# Patient Record
Sex: Female | Born: 1975 | Race: White | Hispanic: No | Marital: Married | Smoking: Current every day smoker
Health system: Southern US, Community
[De-identification: ages and names within clinical notes are randomized; demographics above are authoritative.]

## PROBLEM LIST (undated history)

## (undated) DIAGNOSIS — R51 Headache: Secondary | ICD-10-CM

## (undated) DIAGNOSIS — K5792 Diverticulitis of intestine, part unspecified, without perforation or abscess without bleeding: Secondary | ICD-10-CM

## (undated) DIAGNOSIS — G5603 Carpal tunnel syndrome, bilateral upper limbs: Secondary | ICD-10-CM

## (undated) DIAGNOSIS — F32A Depression, unspecified: Secondary | ICD-10-CM

## (undated) DIAGNOSIS — F329 Major depressive disorder, single episode, unspecified: Secondary | ICD-10-CM

## (undated) HISTORY — DX: Major depressive disorder, single episode, unspecified: F32.9

## (undated) HISTORY — DX: Diverticulitis of intestine, part unspecified, without perforation or abscess without bleeding: K57.92

## (undated) HISTORY — DX: Depression, unspecified: F32.A

---

## 1999-03-22 ENCOUNTER — Emergency Department (HOSPITAL_COMMUNITY): Admission: EM | Admit: 1999-03-22 | Discharge: 1999-03-22 | Payer: Self-pay | Admitting: Emergency Medicine

## 2008-05-25 ENCOUNTER — Emergency Department (HOSPITAL_COMMUNITY): Admission: EM | Admit: 2008-05-25 | Discharge: 2008-05-25 | Payer: Self-pay | Admitting: Emergency Medicine

## 2009-12-20 ENCOUNTER — Emergency Department (HOSPITAL_COMMUNITY): Admission: EM | Admit: 2009-12-20 | Discharge: 2009-12-20 | Payer: Self-pay | Admitting: Emergency Medicine

## 2010-01-14 ENCOUNTER — Emergency Department (HOSPITAL_COMMUNITY)
Admission: EM | Admit: 2010-01-14 | Discharge: 2010-01-14 | Payer: Self-pay | Source: Home / Self Care | Admitting: Emergency Medicine

## 2011-08-28 ENCOUNTER — Inpatient Hospital Stay (HOSPITAL_COMMUNITY)
Admission: EM | Admit: 2011-08-28 | Discharge: 2011-09-03 | DRG: 391 | Disposition: A | Discharge status: Home / Self Care | Payer: MEDICAID | Attending: General Surgery | Admitting: General Surgery

## 2011-08-28 ENCOUNTER — Emergency Department (HOSPITAL_COMMUNITY): Payer: Self-pay

## 2011-08-28 ENCOUNTER — Encounter (HOSPITAL_COMMUNITY): Payer: Self-pay | Admitting: *Deleted

## 2011-08-28 DIAGNOSIS — F17200 Nicotine dependence, unspecified, uncomplicated: Secondary | ICD-10-CM | POA: Diagnosis present

## 2011-08-28 DIAGNOSIS — K631 Perforation of intestine (nontraumatic): Secondary | ICD-10-CM | POA: Diagnosis present

## 2011-08-28 DIAGNOSIS — R590 Localized enlarged lymph nodes: Secondary | ICD-10-CM | POA: Diagnosis present

## 2011-08-28 DIAGNOSIS — K5780 Diverticulitis of intestine, part unspecified, with perforation and abscess without bleeding: Secondary | ICD-10-CM

## 2011-08-28 DIAGNOSIS — K5732 Diverticulitis of large intestine without perforation or abscess without bleeding: Secondary | ICD-10-CM

## 2011-08-28 DIAGNOSIS — K5720 Diverticulitis of large intestine with perforation and abscess without bleeding: Secondary | ICD-10-CM | POA: Diagnosis present

## 2011-08-28 DIAGNOSIS — Z6836 Body mass index (BMI) 36.0-36.9, adult: Secondary | ICD-10-CM

## 2011-08-28 DIAGNOSIS — E669 Obesity, unspecified: Secondary | ICD-10-CM | POA: Diagnosis present

## 2011-08-28 HISTORY — DX: Headache: R51

## 2011-08-28 LAB — URINALYSIS, ROUTINE W REFLEX MICROSCOPIC
Ketones, ur: NEGATIVE mg/dL
Leukocytes, UA: NEGATIVE
Nitrite: NEGATIVE
Specific Gravity, Urine: 1.025 (ref 1.005–1.030)
pH: 6 (ref 5.0–8.0)

## 2011-08-28 LAB — COMPREHENSIVE METABOLIC PANEL
BUN: 11 mg/dL (ref 6–23)
Calcium: 9 mg/dL (ref 8.4–10.5)
Creatinine, Ser: 0.82 mg/dL (ref 0.50–1.10)
GFR calc Af Amer: >90 mL/min (ref >90)
Glucose, Bld: 125 mg/dL — ABNORMAL HIGH (ref 70–99)
Total Protein: 8.1 g/dL (ref 6.0–8.3)

## 2011-08-28 LAB — CBC WITH DIFFERENTIAL/PLATELET
Eosinophils Absolute: 0 10*3/uL (ref 0.0–0.7)
Eosinophils Relative: 0 % (ref 0–5)
Hemoglobin: 14.4 g/dL (ref 12.0–15.0)
Lymphs Abs: 1.4 10*3/uL (ref 0.7–4.0)
MCH: 30.1 pg (ref 26.0–34.0)
MCHC: 34.4 g/dL (ref 30.0–36.0)
MCV: 87.3 fL (ref 78.0–100.0)
Monocytes Relative: 7 % (ref 3–12)
RBC: 4.79 MIL/uL (ref 3.87–5.11)

## 2011-08-28 LAB — URINE MICROSCOPIC-ADD ON

## 2011-08-28 MED ORDER — PANTOPRAZOLE SODIUM 40 MG IV SOLR
40.0000 mg | Freq: Every day | INTRAVENOUS | Status: DC
  Administered 2011-08-28 – 2011-09-02 (×6): 40 mg via INTRAVENOUS
  Filled 2011-08-28 (×7): qty 40

## 2011-08-28 MED ORDER — ONDANSETRON HCL 4 MG/2ML IJ SOLN
4.0000 mg | Freq: Four times a day (QID) | INTRAMUSCULAR | Status: DC | PRN
  Administered 2011-08-28 – 2011-09-02 (×6): 4 mg via INTRAVENOUS
  Filled 2011-08-28 (×7): qty 2

## 2011-08-28 MED ORDER — ACETAMINOPHEN 325 MG PO TABS
650.0000 mg | ORAL_TABLET | Freq: Four times a day (QID) | ORAL | Status: DC | PRN
  Administered 2011-08-30 (×2): 650 mg via ORAL
  Filled 2011-08-28 (×2): qty 2

## 2011-08-28 MED ORDER — SODIUM CHLORIDE 0.9 % IV SOLN
1.0000 g | INTRAVENOUS | Status: DC
  Administered 2011-08-29 – 2011-09-02 (×5): 1 g via INTRAVENOUS
  Filled 2011-08-28 (×6): qty 1

## 2011-08-28 MED ORDER — IOHEXOL 300 MG/ML  SOLN
100.0000 mL | Freq: Once | INTRAMUSCULAR | Status: AC | PRN
  Administered 2011-08-28: 100 mL via INTRAVENOUS

## 2011-08-28 MED ORDER — SODIUM CHLORIDE 0.9 % IV SOLN
INTRAVENOUS | Status: DC
  Administered 2011-08-28: 125 mL/h via INTRAVENOUS

## 2011-08-28 MED ORDER — MORPHINE SULFATE 2 MG/ML IJ SOLN
1.0000 mg | INTRAMUSCULAR | Status: DC | PRN
  Administered 2011-08-28: 4 mg via INTRAVENOUS
  Administered 2011-08-28: 2 mg via INTRAVENOUS
  Administered 2011-08-28: 4 mg via INTRAVENOUS
  Administered 2011-08-29 – 2011-08-31 (×17): 2 mg via INTRAVENOUS
  Administered 2011-08-31: 4 mg via INTRAVENOUS
  Administered 2011-09-01 – 2011-09-02 (×4): 2 mg via INTRAVENOUS
  Filled 2011-08-28 (×13): qty 1
  Filled 2011-08-28: qty 2
  Filled 2011-08-28 (×5): qty 1
  Filled 2011-08-28: qty 2
  Filled 2011-08-28: qty 1
  Filled 2011-08-28: qty 2
  Filled 2011-08-28 (×4): qty 1

## 2011-08-28 MED ORDER — HYOSCYAMINE SULFATE 0.125 MG PO TABS
0.2500 mg | ORAL_TABLET | Freq: Once | ORAL | Status: AC
  Administered 2011-08-28: 0.25 mg via ORAL
  Filled 2011-08-28: qty 2

## 2011-08-28 MED ORDER — ACETAMINOPHEN 650 MG RE SUPP: 650.0000 mg | Freq: Four times a day (QID) | RECTAL | Status: DC | PRN

## 2011-08-28 MED ORDER — ONDANSETRON HCL 4 MG/2ML IJ SOLN
4.0000 mg | Freq: Once | INTRAMUSCULAR | Status: AC
  Administered 2011-08-28: 4 mg via INTRAVENOUS
  Filled 2011-08-28: qty 2

## 2011-08-28 MED ORDER — POTASSIUM CHLORIDE IN NACL 20-0.9 MEQ/L-% IV SOLN
INTRAVENOUS | Status: DC
  Administered 2011-08-28 – 2011-08-30 (×5): via INTRAVENOUS
  Administered 2011-08-30: 100 mL/h via INTRAVENOUS
  Administered 2011-08-31 – 2011-09-03 (×5): via INTRAVENOUS
  Filled 2011-08-28 (×17): qty 1000

## 2011-08-28 MED ORDER — HEPARIN SODIUM (PORCINE) 5000 UNIT/ML IJ SOLN
5000.0000 [IU] | Freq: Three times a day (TID) | INTRAMUSCULAR | Status: DC
Start: 2011-08-28 — End: 2011-09-03
  Administered 2011-08-28 – 2011-09-03 (×18): 5000 [IU] via SUBCUTANEOUS
  Filled 2011-08-28 (×22): qty 1

## 2011-08-28 MED ORDER — SODIUM CHLORIDE 0.9 % IV BOLUS (SEPSIS)
500.0000 mL | Freq: Once | INTRAVENOUS | Status: AC
  Administered 2011-08-28: 500 mL via INTRAVENOUS

## 2011-08-28 MED ORDER — HYDROMORPHONE HCL PF 1 MG/ML IJ SOLN
1.0000 mg | Freq: Once | INTRAMUSCULAR | Status: AC
  Administered 2011-08-28: 1 mg via INTRAVENOUS
  Filled 2011-08-28: qty 1

## 2011-08-28 MED ORDER — SODIUM CHLORIDE 0.9 % IV SOLN
1.0000 g | Freq: Once | INTRAVENOUS | Status: AC
  Administered 2011-08-28: 1 g via INTRAVENOUS
  Filled 2011-08-28: qty 1

## 2011-08-28 MED ORDER — NICOTINE 14 MG/24HR TD PT24
14.0000 mg | MEDICATED_PATCH | Freq: Every day | TRANSDERMAL | Status: DC | PRN
  Administered 2011-08-28: 14 mg via TRANSDERMAL
  Filled 2011-08-28 (×3): qty 1

## 2011-08-28 NOTE — Consult Note (Signed)
Reason for Consult:diverticulitis Referring Physician: Georgene Kopper is an 36 y.o. female.  HPI: Patient's a 36 year old female who presents with onset of abdominal pain Wednesday morning he was in her groin and then moved her lower abdomen. She describes it as sharp was coming and going all day yesterday toes she presented to the emergency room and was treated. She developed nausea and vomiting this a.m. Pain was on improved after medication in the ER. Her last bowel movement was 2 days ago there was no blood, she is normally quite regular. She does not complain of pain while lying still. She said it is very painful on the drive in this morning from home. She's not been able to Eat for the last 24 hours. Labs on evaluation in the ER shows a white count of 23,000. H./H. stable. UA shows a large amount of blood nitrates are negative there were many bacteria. CT scan shows sigmoid diverticula stranding the pericolonic fat in the mid pelvis medial to the sigmoid colon above the bladder there are small pockets of extraluminal air noted within the mesentery adjacent to the sigmoid colon. Findings are consistent with perforated diverticulitis with no abscess noted. There is a short segment of thickened small bowel wall in the terminal small bowel mid pelvis adjacent to the sigmoid colon most likely inflammatory enteritis. We were asked by the emergency room to see the patient in consultation.         History reviewed. No pertinent past medical history. History of headaches and painful dentition. She takes [place] powders for this daily.  History reviewed. No pertinent past surgical history. None  No family history on file. mother died of a rectal cancer with metastases. Father with coronary disease, family history of colon cancer. One brother living and in good health.  Social History:  Tobacco: One pack per day for 16 years. EtOH: None  drugs: Occasional marijuana use. Allergies: No Known  Allergies  Medications:  Prior to Admission:  (Not in a hospital admission) Scheduled:   . ertapenem  1 g Intravenous Once  .  HYDROmorphone (DILAUDID) injection  1 mg Intravenous Once  . hyoscyamine  0.25 mg Oral Once  . ondansetron  4 mg Intravenous Once  . sodium chloride  500 mL Intravenous Once   Continuous:   . sodium chloride 125 mL/hr (08/28/11 1302)   [id number] Anti-infectives     Start     Dose/Rate Route Frequency Ordered Stop   08/28/11 1300   ertapenem (INVANZ) 1 g in sodium chloride 0.9 % 50 mL IVPB        1 g 100 mL/hr over 30 Minutes Intravenous  Once 08/28/11 1247 08/28/11 1336          Results for orders placed during the hospital encounter of 08/28/11 (from the past 48 hour(s))  URINALYSIS, ROUTINE W REFLEX MICROSCOPIC     Status: Abnormal   Collection Time   08/28/11  8:37 AM      Component Value Range Comment   Color, Urine AMBER (*) YELLOW BIOCHEMICALS MAY BE AFFECTED BY COLOR   APPearance HAZY (*) CLEAR    Specific Gravity, Urine 1.025  1.005 - 1.030    pH 6.0  5.0 - 8.0    Glucose, UA NEGATIVE  NEGATIVE mg/dL    Hgb urine dipstick LARGE (*) NEGATIVE    Bilirubin Urine SMALL (*) NEGATIVE    Ketones, ur NEGATIVE  NEGATIVE mg/dL    Protein, ur 30 (*) NEGATIVE mg/dL  Urobilinogen, UA 2.0 (*) 0.0 - 1.0 mg/dL    Nitrite NEGATIVE  NEGATIVE    Leukocytes, UA NEGATIVE  NEGATIVE   PREGNANCY, URINE     Status: Normal   Collection Time   08/28/11  8:37 AM      Component Value Range Comment   Preg Test, Ur NEGATIVE  NEGATIVE   URINE MICROSCOPIC-ADD ON     Status: Abnormal   Collection Time   08/28/11  8:37 AM      Component Value Range Comment   Squamous Epithelial / LPF RARE  RARE    WBC, UA 0-2  <3 WBC/hpf    RBC / HPF 7-10  <3 RBC/hpf    Bacteria, UA MANY (*) RARE    Urine-Other MUCOUS PRESENT     CBC WITH DIFFERENTIAL     Status: Abnormal   Collection Time   08/28/11  8:44 AM      Component Value Range Comment   WBC 23.0 (*) 4.0 - 10.5  K/uL    RBC 4.79  3.87 - 5.11 MIL/uL    Hemoglobin 14.4  12.0 - 15.0 g/dL    HCT 11.9  14.7 - 82.9 %    MCV 87.3  78.0 - 100.0 fL    MCH 30.1  26.0 - 34.0 pg    MCHC 34.4  30.0 - 36.0 g/dL    RDW 56.2  13.0 - 86.5 %    Platelets 236  150 - 400 K/uL    Neutrophils Relative 88 (*) 43 - 77 %    Neutro Abs 20.1 (*) 1.7 - 7.7 K/uL    Lymphocytes Relative 6 (*) 12 - 46 %    Lymphs Abs 1.4  0.7 - 4.0 K/uL    Monocytes Relative 7  3 - 12 %    Monocytes Absolute 1.5 (*) 0.1 - 1.0 K/uL    Eosinophils Relative 0  0 - 5 %    Eosinophils Absolute 0.0  0.0 - 0.7 K/uL    Basophils Relative 0  0 - 1 %    Basophils Absolute 0.0  0.0 - 0.1 K/uL   COMPREHENSIVE METABOLIC PANEL     Status: Abnormal   Collection Time   08/28/11  8:44 AM      Component Value Range Comment   Sodium 132 (*) 135 - 145 mEq/L    Potassium 3.6  3.5 - 5.1 mEq/L    Chloride 97  96 - 112 mEq/L    CO2 21  19 - 32 mEq/L    Glucose, Bld 125 (*) 70 - 99 mg/dL    BUN 11  6 - 23 mg/dL    Creatinine, Ser 7.84  0.50 - 1.10 mg/dL    Calcium 9.0  8.4 - 69.6 mg/dL    Total Protein 8.1  6.0 - 8.3 g/dL    Albumin 3.6  3.5 - 5.2 g/dL    AST 11  0 - 37 U/L    ALT 10  0 - 35 U/L    Alkaline Phosphatase 77  39 - 117 U/L    Total Bilirubin 1.0  0.3 - 1.2 mg/dL    GFR calc non Af Amer >90  >90 mL/min    GFR calc Af Amer >90  >90 mL/min     Ct Abdomen Pelvis W Contrast  08/28/2011  *RADIOLOGY REPORT*  Clinical Data: Diffuse abdominal pain, nausea, vomiting, elevated WBC  CT ABDOMEN AND PELVIS WITH CONTRAST  Technique:  Multidetector CT imaging of the  abdomen and pelvis was performed following the standard protocol during bolus administration of intravenous contrast.  Contrast: OMNIPAQUE IOHEXOL 300 MG/ML  SOLN  Comparison: None.  Findings: Lung bases are unremarkable.  Sagittal images of the spine shows no destructive bony lesions.  Liver, spleen, pancreas and adrenals are unremarkable.  No calcified gallstones are noted within  gallbladder.  No aortic aneurysm.  Enhanced kidneys are symmetrical in size.  No hydronephrosis or hydroureter.  No small bowel obstruction.  Multiple small mesenteric lymph nodes are noted the largest in right lower quadrant mesentery measures 9 x 7 mm.  There is no pericecal inflammation.  Normal appendix is partially visualized in axial image 62.  The uterus is retroflexed.  Small amount of free fluid is noted within pelvis.  No adnexal mass is noted.  Multiple small pockets of extraluminal air noted in the mid pelvis above the urinary bladder within mesentery adjacent to sigmoid colon.  Sigmoid colon diverticula are noted.  Mild thickening of the sigmoid colon wall.  Findings are consistent with perforated diverticulitis.  There is stranding of mesenteric fat in mid pelvis above the urinary bladder.  In axial image 79 there is a thickened wall small bowel loop internal os small bowel just above the urinary bladder.  This is most likely due to satellite enteritis due to inflammatory changes in the sigmoid colon.  Contrast material is noted within cecum without evidence of small bowel or colonic obstruction.  IMPRESSION:  1.  Sigmoid colon diverticula are noted.  There is stranding of the pericolonic fat in mid pelvis medial to sigmoid colon above the urinary bladder.  Small pockets of extraluminal air noted within mesentery adjacent to sigmoid colon.  Findings are consistent with perforated diverticulitis.  No definite abscess is noted. No extraluminal contrast material.   2.  There is a short segment thickening of the small bowel wall in terminal small bowel mid pelvis adjacent to sigmoid colon. Findings most likely due to satellite enteritis due to inflammatory changes within sigmoid colon. No small bowel obstruction.    3.  Retroflexed uterus.  Normal appendix is partially visualized. 4.  No hydronephrosis or hydroureter.  Critical findings discussed with Renne Crigler from [facility] Emergency room  Original  Report Authenticated By: Natasha Mead, M.D.    Review of Systems  Constitutional: Positive for chills. Negative for weight loss, malaise/fatigue and diaphoresis. Fever: up to 100 yesterday.  Eyes: Negative.   Respiratory: Positive for wheezing (occasional with heavy tobacco use).   Cardiovascular: Positive for orthopnea (she sleeps on 2 pillows for comfort). Negative for claudication, leg swelling and PND.       She snores, no apnea   Gastrointestinal: Positive for nausea, vomiting and abdominal pain. Negative for heartburn, diarrhea, constipation, blood in stool and melena.  Genitourinary: Negative.   Musculoskeletal: Negative.        Complains of pain right thigh due to varicose vein at this site.  Skin: Negative.   Neurological: Negative.  Negative for weakness.  Endo/Heme/Allergies: Negative.   Psychiatric/Behavioral: Positive for depression. The patient is nervous/anxious.        Occasional mariajuana use   Blood pressure 99/51, pulse 71, temperature 98.7 F (37.1 C), temperature source Oral, resp. rate 16, last menstrual period 08/04/2011, SpO2 95.00%. Physical Exam  Constitutional: She is oriented to person, place, and time. She appears well-developed and well-nourished. No distress.  HENT:  Head: Normocephalic and atraumatic.  Nose: Nose normal.  Eyes: Conjunctivae and EOM are normal.  Pupils are equal, round, and reactive to light. Right eye exhibits no discharge. Left eye exhibits no discharge. No scleral icterus.  Neck: Normal range of motion. Neck supple. No JVD present. No tracheal deviation present. No thyromegaly present.  Cardiovascular: Normal rate, regular rhythm, normal heart sounds and intact distal pulses.  Exam reveals no gallop.   No murmur heard. Respiratory: Effort normal and breath sounds normal. No stridor. No respiratory distress. She has no wheezes. She has no rales. She exhibits no tenderness.  GI: Soft. Bowel sounds are normal. She exhibits no distension and  no mass. There is tenderness (she has tenderness all quadrants, but LLQ and RLQ seem the most tender.). There is no rebound and no guarding.  Musculoskeletal: She exhibits no edema.  Lymphadenopathy:    She has no cervical adenopathy.  Neurological: She is alert and oriented to person, place, and time. No cranial nerve deficit.  Skin: Skin is warm and dry. No rash noted. She is not diaphoretic. No erythema. No pallor.  Psychiatric: She has a normal mood and affect. Her behavior is normal. Judgment and thought content normal.    Assessment/Plan: 1. Sigmoid diverticulitis with perforation. 2. Tobacco use 3. BMI 37  Plan: Dr. Andrey Campanile has evaluated the patient and reviewed the CT scan with radiology. Currently plan medical management. She has a large phlegmon on CT scan, so we'll place her in the step down unit tonight. Continue and Invanz. N.p.o. except ice chips, bowel rest. We'll repeat labs in the a.m. and follow her progress closely. Dr. Andrey Campanile has discussed this with the patient and family. [facility] physician assistant for Dr. Gaynelle Adu.     Mustafa Potts 08/28/2011, 2:53 PM

## 2011-08-28 NOTE — ED Provider Notes (Signed)
Medical screening examination/treatment/procedure(s) were conducted as a shared visit with non-physician practitioner(s) and myself.  I personally evaluated the patient during the encounter.  35yF with abdominal pain. CT significant for diverticulitis. Small foci of extraluminal air. No abscess. NPO. Abx and surgical consult.    Raeford Razor, MD 08/28/11 1255

## 2011-08-28 NOTE — ED Notes (Signed)
Pt stating that she has finished drinking contrast. Waiting on CT

## 2011-08-28 NOTE — Progress Notes (Signed)
CM spoke with pt who confirms self [place] resident with no have a pcp county. Discussed the importance of a pcp for f/u. Reviewed Health connect number to assist with finding self pay provider close to pt's residence. Reviewed resources for [facility], general medial clinics, medications-[email or web address], housing, DSS, health Department and other resources in [employer] including crisis programs Pt voiced understanding and appreciation of resources provided

## 2011-08-28 NOTE — ED Provider Notes (Signed)
History     CSN: [id number]  Arrival date & time 08/28/11  0804   First MD Initiated Contact with Patient 08/28/11 [phone]      Chief Complaint  Patient presents with  . Abdominal Pain    (Consider location/radiation/quality/duration/timing/severity/associated sxs/prior treatment) HPI Comments: Patient with history of no past surgeries on her abdomen, G0 P0, presents with complaint of lower abdominal pain that has become more generalized across her entire abdomen for the past day and a half. Patient states that the pain is cramping in nature and will come and go. She has had one episode of nonbloody, nonbilious vomiting today. She denies diarrhea or dysuria, fever/chills. Patient took ibuprofen with some relief. Patient's last period was 3 and half weeks ago and was normal for her. Palpation makes symptoms worse. Onset was acute. Course is constant. Patient has history of IBS but states this feels much different.   Patient is a 36 y.o. female presenting with abdominal pain. The history is provided by the patient.  Abdominal Pain The primary symptoms of the illness include abdominal pain, nausea and vomiting. The primary symptoms of the illness do not include fever, shortness of breath, diarrhea, hematemesis, hematochezia, dysuria, vaginal discharge or vaginal bleeding. The current episode started yesterday. The onset of the illness was sudden. The problem has been gradually worsening.  The patient states that she believes she is currently not pregnant. The patient has not had a change in bowel habit. Additional symptoms associated with the illness include anorexia. Symptoms associated with the illness do not include chills, constipation, hematuria, frequency or back pain. Significant associated medical issues do not include diverticulitis.    History reviewed. No pertinent past medical history.  History reviewed. No pertinent past surgical history.  No family history on file.  History    Substance Use Topics  . Smoking status: Current Everyday Smoker  . Smokeless tobacco: Not on file  . Alcohol Use: No    OB History    Grav Para Term Preterm Abortions TAB SAB Ect Mult Living                  Review of Systems  Constitutional: Negative for fever and chills.  HENT: Negative for sore throat and rhinorrhea.   Eyes: Negative for redness.  Respiratory: Negative for cough and shortness of breath.   Cardiovascular: Negative for chest pain.  Gastrointestinal: Positive for nausea, vomiting, abdominal pain and anorexia. Negative for diarrhea, constipation, blood in stool, hematochezia and hematemesis.  Genitourinary: Negative for dysuria, frequency, hematuria, vaginal bleeding and vaginal discharge.  Musculoskeletal: Negative for myalgias and back pain.  Skin: Negative for rash.  Neurological: Negative for headaches.    Allergies  Review of patient's allergies indicates no known allergies.  Home Medications  No current outpatient prescriptions on file.  BP 120/70  Pulse 89  Temp 98.9 F (37.2 C) (Oral)  Resp 18  SpO2 99%  LMP 08/04/2011  Physical Exam  Nursing note and vitals reviewed. Constitutional: She appears well-developed and well-nourished.  HENT:  Head: Normocephalic and atraumatic.  Eyes: Conjunctivae are normal. Right eye exhibits no discharge. Left eye exhibits no discharge.  Neck: Normal range of motion. Neck supple.  Cardiovascular: Normal rate, regular rhythm and normal heart sounds.   Pulmonary/Chest: Effort normal and breath sounds normal.  Abdominal: Soft. Bowel sounds are normal. She exhibits no distension. There is generalized tenderness. There is no rigidity, no rebound, no guarding, no CVA tenderness, no tenderness at McBurney's point and negative  Murphy's sign.    Neurological: She is alert.  Skin: Skin is warm and dry.  Psychiatric: She has a normal mood and affect.    ED Course  Procedures (including critical care time)  Labs  Reviewed  CBC WITH DIFFERENTIAL - Abnormal; Notable for the following:    WBC 23.0 (*)     Neutrophils Relative 88 (*)     Neutro Abs 20.1 (*)     Lymphocytes Relative 6 (*)     Monocytes Absolute 1.5 (*)     All other components within normal limits  COMPREHENSIVE METABOLIC PANEL - Abnormal; Notable for the following:    Sodium 132 (*)     Glucose, Bld 125 (*)     All other components within normal limits  URINALYSIS, ROUTINE W REFLEX MICROSCOPIC - Abnormal; Notable for the following:    Color, Urine AMBER (*)  BIOCHEMICALS MAY BE AFFECTED BY COLOR   APPearance HAZY (*)     Hgb urine dipstick LARGE (*)     Bilirubin Urine SMALL (*)     Protein, ur 30 (*)     Urobilinogen, UA 2.0 (*)     All other components within normal limits  URINE MICROSCOPIC-ADD ON - Abnormal; Notable for the following:    Bacteria, UA MANY (*)     All other components within normal limits  PREGNANCY, URINE   Ct Abdomen Pelvis W Contrast  08/28/2011  *RADIOLOGY REPORT*  Clinical Data: Diffuse abdominal pain, nausea, vomiting, elevated WBC  CT ABDOMEN AND PELVIS WITH CONTRAST  Technique:  Multidetector CT imaging of the abdomen and pelvis was performed following the standard protocol during bolus administration of intravenous contrast.  Contrast: OMNIPAQUE IOHEXOL 300 MG/ML  SOLN  Comparison: None.  Findings: Lung bases are unremarkable.  Sagittal images of the spine shows no destructive bony lesions.  Liver, spleen, pancreas and adrenals are unremarkable.  No calcified gallstones are noted within gallbladder.  No aortic aneurysm.  Enhanced kidneys are symmetrical in size.  No hydronephrosis or hydroureter.  No small bowel obstruction.  Multiple small mesenteric lymph nodes are noted the largest in right lower quadrant mesentery measures 9 x 7 mm.  There is no pericecal inflammation.  Normal appendix is partially visualized in axial image 62.  The uterus is retroflexed.  Small amount of free fluid is noted within  pelvis.  No adnexal mass is noted.  Multiple small pockets of extraluminal air noted in the mid pelvis above the urinary bladder within mesentery adjacent to sigmoid colon.  Sigmoid colon diverticula are noted.  Mild thickening of the sigmoid colon wall.  Findings are consistent with perforated diverticulitis.  There is stranding of mesenteric fat in mid pelvis above the urinary bladder.  In axial image 79 there is a thickened wall small bowel loop internal os small bowel just above the urinary bladder.  This is most likely due to satellite enteritis due to inflammatory changes in the sigmoid colon.  Contrast material is noted within cecum without evidence of small bowel or colonic obstruction.  IMPRESSION:  1.  Sigmoid colon diverticula are noted.  There is stranding of the pericolonic fat in mid pelvis medial to sigmoid colon above the urinary bladder.  Small pockets of extraluminal air noted within mesentery adjacent to sigmoid colon.  Findings are consistent with perforated diverticulitis.  No definite abscess is noted. No extraluminal contrast material.   2.  There is a short segment thickening of the small bowel wall in terminal small  bowel mid pelvis adjacent to sigmoid colon. Findings most likely due to satellite enteritis due to inflammatory changes within sigmoid colon. No small bowel obstruction.    3.  Retroflexed uterus.  Normal appendix is partially visualized. 4.  No hydronephrosis or hydroureter.  Critical findings discussed with Renne Crigler from [facility] Emergency room  Original Report Authenticated By: Natasha Mead, M.D.     1. Perforated diverticulitis     8:29 AM Patient seen and examined. Work-up initiated. Medications ordered.   Vital signs reviewed and are as follows: Filed Vitals:   08/28/11 0808  BP: 120/70  Pulse: 89  Temp: 98.9 F (37.2 C)  Resp: 18   9:48 AM CT scan ordered given elevated WBC count. Patient just received IV. States her pain is better if she doesn't  move. Patient discussed with Dr. Juleen China.   1:04 PM Spoke with radiologist. Patient has ruptured diverticulitis. Invanz ordered. Patient informed. Her pain is still controlled. D/w Dr. Juleen China. Surgery paged.   1:36 PM Spoke with surgery PA in ED who is aware of patient. Patient is up in room.   BP 99/51  Pulse 71  Temp 98.7 F (37.1 C) (Oral)  Resp 16  SpO2 95%  LMP 08/04/2011   MDM  Surgical admit        Renne Crigler, [place] 08/28/11 1336

## 2011-08-28 NOTE — ED Notes (Signed)
Pt made NPO.

## 2011-08-28 NOTE — ED Notes (Signed)
Pt c/o n/v/abd pain x1.5 days. Emesis x1 today. Generalized abd pain, sharp, shooting, "like menstrual cramps."

## 2011-08-28 NOTE — ED Notes (Signed)
Pt requesting something for pain. Surgeon requested to give patient PRN morphine and zofran. Order confirmed with Diane charge RN

## 2011-08-29 DIAGNOSIS — E669 Obesity, unspecified: Secondary | ICD-10-CM | POA: Diagnosis present

## 2011-08-29 DIAGNOSIS — K5720 Diverticulitis of large intestine with perforation and abscess without bleeding: Secondary | ICD-10-CM | POA: Diagnosis present

## 2011-08-29 DIAGNOSIS — R590 Localized enlarged lymph nodes: Secondary | ICD-10-CM | POA: Diagnosis present

## 2011-08-29 LAB — CBC WITH DIFFERENTIAL/PLATELET
Basophils Absolute: 0 10*3/uL (ref 0.0–0.1)
Basophils Relative: 0 % (ref 0–1)
Eosinophils Relative: 0 % (ref 0–5)
Lymphocytes Relative: 8 % — ABNORMAL LOW (ref 12–46)
MCHC: 33 g/dL (ref 30.0–36.0)
Monocytes Absolute: 1.4 10*3/uL — ABNORMAL HIGH (ref 0.1–1.0)
Neutro Abs: 15 10*3/uL — ABNORMAL HIGH (ref 1.7–7.7)
Platelets: 217 10*3/uL (ref 150–400)
RDW: 12.9 % (ref 11.5–15.5)
WBC: 17.9 10*3/uL — ABNORMAL HIGH (ref 4.0–10.5)

## 2011-08-29 LAB — BASIC METABOLIC PANEL
BUN: 9 mg/dL (ref 6–23)
Calcium: 8.3 mg/dL — ABNORMAL LOW (ref 8.4–10.5)
Creatinine, Ser: 0.81 mg/dL (ref 0.50–1.10)
GFR calc Af Amer: >90 mL/min (ref >90)

## 2011-08-29 NOTE — ED Provider Notes (Signed)
Medical screening examination/treatment/procedure(s) were conducted as a shared visit with non-physician practitioner(s) and myself.  I personally evaluated the patient during the encounter.  Please see completed note for this encounter.  Raeford Razor, MD 08/29/11 [phone]

## 2011-08-29 NOTE — Progress Notes (Signed)
Utilization review completed.  

## 2011-08-29 NOTE — Progress Notes (Signed)
Subjective: Feels better  Less pain  Objective: Vital signs in last 24 hours: Temp:  [98 F (36.7 C)-99.3 F (37.4 C)] 98.7 F (37.1 C) (07/26 1000) Pulse Rate:  [69-81] 80  (07/26 1000) Resp:  [15-18] 18  (07/26 1000) BP: (98-129)/(51-70) 129/65 mmHg (07/26 1000) SpO2:  [95 %-99 %] 99 % (07/26 1000) Weight:  [265 lb (120.203 kg)] 265 lb (120.203 kg) (07/25 1720) Last BM Date: 08/26/11  Intake/Output from previous day: 07/25 0701 - 07/26 0700 In: 1248.3 [I.V.:1248.3] Out: 0  Intake/Output this shift: Total I/O In: -  Out: 3 [Urine:3]  GI: obese.  mild BLQ tenderness but soft nondistended  Lab Results:   Basename 08/29/11 0347 08/28/11 0844  WBC 17.9* 23.0*  HGB 12.7 14.4  HCT 38.5 41.8  PLT 217 236   BMET  Basename 08/29/11 0347 08/28/11 0844  NA 132* 132*  K 3.8 3.6  CL 100 97  CO2 21 21  GLUCOSE 78 125*  BUN 9 11  CREATININE 0.81 0.82  CALCIUM 8.3* 9.0   PT/INR No results found for this basename: LABPROT:2,INR:2 in the last 72 hours ABG No results found for this basename: PHART:2,PCO2:2,PO2:2,HCO3:2 in the last 72 hours  Studies/Results: Ct Abdomen Pelvis W Contrast  08/28/2011  *RADIOLOGY REPORT*  Clinical Data: Diffuse abdominal pain, nausea, vomiting, elevated WBC  CT ABDOMEN AND PELVIS WITH CONTRAST  Technique:  Multidetector CT imaging of the abdomen and pelvis was performed following the standard protocol during bolus administration of intravenous contrast.  Contrast: OMNIPAQUE IOHEXOL 300 MG/ML  SOLN  Comparison: None.  Findings: Lung bases are unremarkable.  Sagittal images of the spine shows no destructive bony lesions.  Liver, spleen, pancreas and adrenals are unremarkable.  No calcified gallstones are noted within gallbladder.  No aortic aneurysm.  Enhanced kidneys are symmetrical in size.  No hydronephrosis or hydroureter.  No small bowel obstruction.  Multiple small mesenteric lymph nodes are noted the largest in right lower quadrant  mesentery measures 9 x 7 mm.  There is no pericecal inflammation.  Normal appendix is partially visualized in axial image 62.  The uterus is retroflexed.  Small amount of free fluid is noted within pelvis.  No adnexal mass is noted.  Multiple small pockets of extraluminal air noted in the mid pelvis above the urinary bladder within mesentery adjacent to sigmoid colon.  Sigmoid colon diverticula are noted.  Mild thickening of the sigmoid colon wall.  Findings are consistent with perforated diverticulitis.  There is stranding of mesenteric fat in mid pelvis above the urinary bladder.  In axial image 79 there is a thickened wall small bowel loop internal os small bowel just above the urinary bladder.  This is most likely due to satellite enteritis due to inflammatory changes in the sigmoid colon.  Contrast material is noted within cecum without evidence of small bowel or colonic obstruction.  IMPRESSION:  1.  Sigmoid colon diverticula are noted.  There is stranding of the pericolonic fat in mid pelvis medial to sigmoid colon above the urinary bladder.  Small pockets of extraluminal air noted within mesentery adjacent to sigmoid colon.  Findings are consistent with perforated diverticulitis.  No definite abscess is noted. No extraluminal contrast material.   2.  There is a short segment thickening of the small bowel wall in terminal small bowel mid pelvis adjacent to sigmoid colon. Findings most likely due to satellite enteritis due to inflammatory changes within sigmoid colon. No small bowel obstruction.    3.  Retroflexed uterus.  Normal appendix is partially visualized. 4.  No hydronephrosis or hydroureter.  Critical findings discussed with Renne Crigler from [facility] Emergency room  Original Report Authenticated By: Natasha Mead, M.D.    Anti-infectives: Anti-infectives     Start     Dose/Rate Route Frequency Ordered Stop   08/29/11 1400   ertapenem (INVANZ) 1 g in sodium chloride 0.9 % 50 mL IVPB        1  g 100 mL/hr over 30 Minutes Intravenous Every 24 hours 08/28/11 1528     08/28/11 1300   ertapenem (INVANZ) 1 g in sodium chloride 0.9 % 50 mL IVPB        1 g 100 mL/hr over 30 Minutes Intravenous  Once 08/28/11 1247 08/28/11 1336          Assessment/Plan: Acute diverticulitis with microperforation   Improved with less pain and decreasing WBC Continue ABX   LOS: 1 day    Amellia Panik A. 08/29/2011

## 2011-08-29 NOTE — Consult Note (Signed)
Pt seen & examined in ED on 7/25 History as below. No prior symptoms  Alert, comfortable, nontoxic Afebrile, not tachy  Obese abd, lower midline-suprapubic-LLQ TTP. No peritonitis, no RT  Labs reviewed CT reviewed with radiologist  Perforated sigmoid diverticulitis Mesenteric LAD Obesity Tobacco use Headaches  Diverticulitis appears contained. Discussed with pt the etiology of diverticulitis and typical management of diverticulitis including non-operative and operative management. Since not toxic appearing, no fever, no tachycardia, no RT/peritonitis - will start with non-operative management- explained that the perforated area may need to be drained in several days if forms an abscess    NPO, bowel rest IV abx Will likely need repeat CT in 5-7 days to follow-up on diverticulitis to see if have formed abscess as well as mesenteric LAD  The mesenteric LAD is abnormal - could be reactive to colonic process or separate etiology Will also need outpt colonoscopy in 6-8 weeks assuming non-operative management is successful.   Mary Sella. Andrey Campanile, MD, FACS General, Bariatric, & Minimally Invasive Surgery [facility] Surgery, [place]

## 2011-08-30 NOTE — Progress Notes (Signed)
Diverticulitis of large intestine with perforation  Subjective: Feels much better today. Less abdominal pain, no nausea, no distention, tolerating clear liquids.Notes somewhat dark urine.  Objective: Vital signs in last 24 hours: Temp:  [98.6 F (37 C)-99.4 F (37.4 C)] 98.6 F (37 C) (07/27 0524) Pulse Rate:  [64-80] 72  (07/27 0524) Resp:  [16-18] 16  (07/27 0524) BP: (109-129)/(54-66) 118/54 mmHg (07/27 0524) SpO2:  [97 %-99 %] 97 % (07/27 0524) Last BM Date: 08/26/11  Intake/Output from previous day: 07/26 0701 - 07/27 0700 In: 2660 [P.O.:240; I.V.:2420] Out: 1753 [Urine:1753] Intake/Output this shift:    General appearance: alert, cooperative and no distress GI: the abdomen is soft. She has some firmness in the left lower quadrant but only very minimal tenderness. There is no rebound or guarding. Bowel sounds are present.  Lab Results:  No results found for this or any previous visit (from the past 24 hour(s)).   Studies/Results Radiology     MEDS, Scheduled    . ertapenem [facility]) IV  1 g Intravenous Q24H  . heparin  5,000 Units Subcutaneous Q8H  . pantoprazole (PROTONIX) IV  40 mg Intravenous QHS     Assessment: Diverticulitis of large intestine with perforation Improving on antibiotic therapy  Plan: Continue current management. If she does well today, we will advance her diet to full liquids tomorrow. We'll recheck a CBC tomorrow  LOS: 2 days    Currie Paris, MD, [facility], [place] [phone]   08/30/2011 8:09 AM

## 2011-08-31 LAB — CBC
HCT: 34.1 % — ABNORMAL LOW (ref 36.0–46.0)
Hemoglobin: 11.3 g/dL — ABNORMAL LOW (ref 12.0–15.0)
MCHC: 33.1 g/dL (ref 30.0–36.0)
RDW: 12.5 % (ref 11.5–15.5)
WBC: 11.5 10*3/uL — ABNORMAL HIGH (ref 4.0–10.5)

## 2011-08-31 MED ORDER — OXYCODONE-ACETAMINOPHEN 5-325 MG PO TABS
1.0000 | ORAL_TABLET | ORAL | Status: DC | PRN
  Administered 2011-08-31 – 2011-09-03 (×16): 1 via ORAL
  Filled 2011-08-31 (×17): qty 1

## 2011-08-31 NOTE — Progress Notes (Signed)
Diverticulitis of large intestine with perforation  Subjective: She feels better today. Less pain but still some in the left lower quadrant. Tolerating clear liquidsHad 3 bowel movements  Objective: Vital signs in last 24 hours: Temp:  [97.8 F (36.6 C)-98.7 F (37.1 C)] 97.8 F (36.6 C) (07/28 0545) Pulse Rate:  [56-61] 61  (07/28 0545) Resp:  [16-18] 18  (07/28 0545) BP: (101-113)/(65-73) 101/65 mmHg (07/28 0545) SpO2:  [97 %-99 %] 98 % (07/28 0545) Last BM Date: 08/30/11  Intake/Output from previous day: 07/27 0701 - 07/28 0700 In: 3150 [P.O.:680; I.V.:2370; IV Piggyback:100] Out: 2250 [Urine:2250] Intake/Output this shift:    General appearance: alert, cooperative and no distress Resp: clear to auscultation bilaterally Cardio: regular rate and rhythm, S1, S2 normal, no murmur, click, rub or gallop GI: soft, nondistended, very mild tenderness in the left lower quadrant. No rebound tenderness. Bowel sounds present.  Lab Results:  Results for orders placed during the hospital encounter of 08/28/11 (from the past 24 hour(s))  CBC     Status: Abnormal   Collection Time   08/31/11  4:05 AM      Component Value Range   WBC 11.5 (*) 4.0 - 10.5 K/uL   RBC 3.85 (*) 3.87 - 5.11 MIL/uL   Hemoglobin 11.3 (*) 12.0 - 15.0 g/dL   HCT 16.1 (*) 09.6 - 04.5 %   MCV 88.6  78.0 - 100.0 fL   MCH 29.4  26.0 - 34.0 pg   MCHC 33.1  30.0 - 36.0 g/dL   RDW 40.9  81.1 - 91.4 %   Platelets 233  150 - 400 K/uL     Studies/Results Radiology     MEDS, Scheduled    . ertapenem [facility]) IV  1 g Intravenous Q24H  . heparin  5,000 Units Subcutaneous Q8H  . pantoprazole (PROTONIX) IV  40 mg Intravenous QHS     Assessment: Diverticulitis of large intestine with perforation Improving with less pain and reduced white count  Plan: Will advance to a full liquid diet.Would recheck abdominal pelvic CT scan on Tuesday.  LOS: 3 days    Currie Paris, MD, [facility]  Surgery, [place] [phone]   08/31/2011 8:06 AM

## 2011-09-01 ENCOUNTER — Other Ambulatory Visit (HOSPITAL_COMMUNITY): Payer: Self-pay

## 2011-09-01 NOTE — Progress Notes (Signed)
Utilization review completed.  

## 2011-09-01 NOTE — Progress Notes (Signed)
  Subjective: She seems to be doing well.  Feels much better than when she came in.  She is taking some pain medicine on occasions, and also notes anxiety makes her feel worse.  Objective: Vital signs in last 24 hours: Temp:  [98.4 F (36.9 C)-98.9 F (37.2 C)] 98.4 F (36.9 C) (07/29 0500) Pulse Rate:  [54-59] 54  (07/29 0500) Resp:  [16] 16  (07/29 0500) BP: (104-135)/(53-83) 124/61 mmHg (07/29 0500) SpO2:  [98 %-100 %] 100 % (07/29 0500) Last BM Date: 08/31/11  600 ml PO recorded, Diet: full liquids, afebrile, VSS, cbc yesterday shows WBC declining,   Intake/Output from previous day: 07/28 0701 - 07/29 0700 In: 2980 [P.O.:600; I.V.:2330; IV Piggyback:50] Out: 2800 [Urine:2800] Intake/Output this shift:    General appearance: alert, cooperative and no distress Resp: clear to auscultation bilaterally GI: soft, still mildly tender to palpation mid abdomen around her umbilicus, normal BM yesterday, daily BM's, tolerating full liquids well.  Lab Results:   Basename 08/31/11 0405  WBC 11.5*  HGB 11.3*  HCT 34.1*  PLT 233    BMET No results found for this basename: NA:2,K:2,CL:2,CO2:2,GLUCOSE:2,BUN:2,CREATININE:2,CALCIUM:2 in the last 72 hours PT/INR No results found for this basename: LABPROT:2,INR:2 in the last 72 hours   Lab 08/28/11 0844  AST 11  ALT 10  ALKPHOS 77  BILITOT 1.0  PROT 8.1  ALBUMIN 3.6     Lipase  No results found for this basename: lipase     Studies/Results: No results found.  Medications:    . ertapenem (INVANZ) IV  1 g Intravenous Q24H  . heparin  5,000 Units Subcutaneous Q8H  . pantoprazole (PROTONIX) IV  40 mg Intravenous QHS    Assessment/Plan Sigmoid diverticulitis with perforation.  2. Tobacco use  3. BMI 37   Plan: repeat CT scan tomorrow, ? Switch to PO meds and home after that. Currently on Invanz.  LOS: 4 days    Doloras Tellado 09/01/2011

## 2011-09-01 NOTE — Progress Notes (Addendum)
Feeling much better.  CT tomorrow to rule out abscess.   Hope for home soon.   Advance to low residue diet.

## 2011-09-02 ENCOUNTER — Inpatient Hospital Stay (HOSPITAL_COMMUNITY): Payer: Self-pay

## 2011-09-02 LAB — COMPREHENSIVE METABOLIC PANEL
ALT: 20 U/L (ref 0–35)
AST: 22 U/L (ref 0–37)
Alkaline Phosphatase: 77 U/L (ref 39–117)
CO2: 25 mEq/L (ref 19–32)
Chloride: 100 mEq/L (ref 96–112)
GFR calc Af Amer: >90 mL/min (ref >90)
GFR calc non Af Amer: >90 mL/min (ref >90)
Glucose, Bld: 81 mg/dL (ref 70–99)
Sodium: 136 mEq/L (ref 135–145)
Total Bilirubin: 0.4 mg/dL (ref 0.3–1.2)

## 2011-09-02 LAB — CBC
Hemoglobin: 12.1 g/dL (ref 12.0–15.0)
MCH: 29.4 pg (ref 26.0–34.0)
Platelets: 266 10*3/uL (ref 150–400)
RBC: 4.12 MIL/uL (ref 3.87–5.11)
WBC: 7.7 10*3/uL (ref 4.0–10.5)

## 2011-09-02 MED ORDER — IOHEXOL 300 MG/ML  SOLN
100.0000 mL | Freq: Once | INTRAMUSCULAR | Status: AC | PRN
  Administered 2011-09-02: 100 mL via INTRAVENOUS

## 2011-09-02 NOTE — Progress Notes (Signed)
  Subjective: Feeling better, a little bloated as is common with her menstral cycle starting.  Over all much improved  Objective: Vital signs in last 24 hours: Temp:  [97.7 F (36.5 C)-98.5 F (36.9 C)] 97.7 F (36.5 C) (07/30 0600) Pulse Rate:  [52-58] 58  (07/30 0600) Resp:  [18] 18  (07/30 0600) BP: (109-150)/(66-76) 126/76 mmHg (07/30 0600) SpO2:  [98 %-100 %] 98 % (07/30 0600) Last BM Date: 09/01/11  720 ml PO, afebrile, VSS, WBC is back to normal, as are the other labs.  Intake/Output from previous day: 07/29 0701 - 07/30 0700 In: 3276.7 [P.O.:720; I.V.:2556.7] Out: 3225 [Urine:3225] Intake/Output this shift:    General appearance: alert, cooperative and no distress Resp: clear to auscultation bilaterally GI: soft, non-tender; bowel sounds normal; no masses,  no organomegaly  Lab Results:   Basename 09/02/11 0412 08/31/11 0405  WBC 7.7 11.5*  HGB 12.1 11.3*  HCT 35.9* 34.1*  PLT 266 233    BMET  Basename 09/02/11 0412  NA 136  K 4.0  CL 100  CO2 25  GLUCOSE 81  BUN 7  CREATININE 0.73  CALCIUM 8.8   PT/INR No results found for this basename: LABPROT:2,INR:2 in the last 72 hours   Lab 09/02/11 0412 08/28/11 0844  AST 22 11  ALT 20 10  ALKPHOS 77 77  BILITOT 0.4 1.0  PROT 6.9 8.1  ALBUMIN 2.7* 3.6     Lipase  No results found for this basename: lipase     Studies/Results: No results found.  Medications:    . ertapenem (INVANZ) IV  1 g Intravenous Q24H  . heparin  5,000 Units Subcutaneous Q8H  . pantoprazole (PROTONIX) IV  40 mg Intravenous QHS    Assessment/Plan 1.Sigmoid diverticulitis with perforation.  2. Tobacco use  3. BMI 37   Plan:  CT for today, she is drinking her contrast now.  If better, hopefully transition to PO antibiotics and home later today.     LOS: 5 days    Fortino Haag 09/02/2011

## 2011-09-02 NOTE — Progress Notes (Signed)
CT worse, but pt clinically improved.    If continues to clinically improve, would let go home tomorrow and po antibiotics, follow up Monday with me.

## 2011-09-03 LAB — CBC
MCH: 29.6 pg (ref 26.0–34.0)
MCHC: 33.7 g/dL (ref 30.0–36.0)
MCV: 87.7 fL (ref 78.0–100.0)
Platelets: 315 10*3/uL (ref 150–400)

## 2011-09-03 MED ORDER — AMOXICILLIN-POT CLAVULANATE 875-125 MG PO TABS
1.0000 | ORAL_TABLET | Freq: Two times a day (BID) | ORAL | Status: DC
  Administered 2011-09-03: 1 via ORAL
  Filled 2011-09-03 (×2): qty 1

## 2011-09-03 MED ORDER — OXYCODONE-ACETAMINOPHEN 5-325 MG PO TABS: 1.0000 | ORAL_TABLET | ORAL | Status: AC | PRN

## 2011-09-03 MED ORDER — AMOXICILLIN-POT CLAVULANATE 875-125 MG PO TABS: 1.0000 | ORAL_TABLET | Freq: Two times a day (BID) | ORAL | Status: AC

## 2011-09-03 MED ORDER — ACETAMINOPHEN 325 MG PO TABS: 650.0000 mg | ORAL_TABLET | Freq: Four times a day (QID) | ORAL | Status: DC | PRN

## 2011-09-03 NOTE — Progress Notes (Signed)
Pt discharge to home. DC instructions and prescriptions x 2 given. No concerns voiced. Significant other at bedside at time of DC. Left unit in wheelchair pushed by nurse tech. Received a pain pill and 1st dose of PO antibiotic prior to leaving. Left in good condition.

## 2011-09-03 NOTE — Progress Notes (Signed)
Physician Discharge Summary  Patient ID: Becky Beck MRN: [id number] DOB/AGE: 37-13-1977 37 y.o.  Admit date: 08/28/2011 Discharge date: 09/03/2011  Admission Diagnoses: 1. Sigmoid diverticulitis with perforation.  2. Tobacco use  3. BMI 37   Discharge Diagnoses: Same Principal Problem:  *Diverticulitis of large intestine with perforation Active Problems:  Obesity, Class III, BMI 40-49.9 (morbid obesity)  Lymphadenopathy, mesenteric   PROCEDURES: None  Hospital Course: Patient's a 36 year old female who presents with onset of abdominal pain Wednesday morning he was in her groin and then moved her lower abdomen. She describes it as sharp was coming and going all day yesterday toes she presented to the emergency room and was treated. She developed nausea and vomiting this a.m. Pain was on improved after medication in the ER. Her last bowel movement was 2 days ago there was no blood, she is normally quite regular. She does not complain of pain while lying still. She said it is very painful on the drive in this morning from home. She's not been able to Eat for the last 24 hours. Labs on evaluation in the ER shows a white count of 23,000. H./H. stable. UA shows a large amount of blood nitrates are negative there were many bacteria. CT scan shows sigmoid diverticula stranding the pericolonic fat in the mid pelvis medial to the sigmoid colon above the bladder there are small pockets of extraluminal air noted within the mesentery adjacent to the sigmoid colon. Findings are consistent with perforated diverticulitis with no abscess noted. There is a short segment of thickened small bowel wall in the terminal small bowel mid pelvis adjacent to the sigmoid colon most likely inflammatory enteritis. We were asked by the emergency room to see the patient in consultation.  She was seen and admitted by Dr. Andrey Campanile.  We placed her on IV antibiotics, and she's made good progress.  He diet has been advanced and  her WBC improved.  Repeat CT on 7/30 showed persistent evidence of perforation, with increased interval amount of gas extraluminal around her sigmoid colon.  She was observed on a low fiber diet for another 24 hours. Her exam was quite normal and she really wanted to go home.  She was placed on oral antibiotics and allowed to go home today.  She will follow up with Dr. Donell Beers next week,in the office, this is being arranged by our office. We have talked about diet and discontinuation of tobacco.  Condition on D/C:  Improved  Disposition: 01-Home or Self Care   Medication List  As of 09/03/2011  3:33 PM   STOP taking these medications         GOODYS EXTRA STRENGTH 520-260-32.5 MG Pack         TAKE these medications         acetaminophen 325 MG tablet   Commonly known as: TYLENOL   Take 2 tablets (650 mg total) by mouth every 6 (six) hours as needed (or Temp > 100).      amoxicillin-clavulanate 875-125 MG per tablet   Commonly known as: AUGMENTIN   Take 1 tablet by mouth every 12 (twelve) hours.      MIDOL COMPLETE 500-60-15 MG Tabs   Generic drug: Acetaminophen-Caff-Pyrilamine   Take 2 tablets by mouth every 6 (six) hours as needed. For pain      oxyCODONE-acetaminophen 5-325 MG per tablet   Commonly known as: PERCOCET/ROXICET   Take 1 tablet by mouth every 4 (four) hours as needed (As needed for moderate pain).  Follow-up Information    Follow up with [facility], MD. Schedule an appointment as soon as possible for a visit in 1 week.   Contact information:   [address] [place] [address] [phone]          Signed: Sherrie George 09/03/2011, 3:33 PM

## 2011-09-03 NOTE — Progress Notes (Signed)
  Subjective: Feels good wants to go home.  Objective: Vital signs in last 24 hours: Temp:  [98 F (36.7 C)-98.6 F (37 C)] 98.6 F (37 C) (07/31 0528) Pulse Rate:  [58-66] 66  (07/31 0528) Resp:  [16-18] 18  (07/31 0528) BP: (103-144)/(62-80) 138/76 mmHg (07/31 0528) SpO2:  [99 %-100 %] 100 % (07/31 0528) Last BM Date: 09/03/11  Low fiber diet, 240 PO recorded, afebrile, VSS, WBC 8700  Intake/Output from previous day: 07/30 0701 - 07/31 0700 In: 2583.3 [P.O.:240; I.V.:2343.3] Out: 1950 [Urine:1950] Intake/Output this shift:    General appearance: alert, cooperative and no distress Resp: clear to auscultation bilaterally and normal percussion bilaterally GI: soft, non-tender; bowel sounds normal; no masses,  no organomegaly  Lab Results:   Basename 09/03/11 0754 09/02/11 0412  WBC 8.7 7.7  HGB 13.7 12.1  HCT 40.6 35.9*  PLT 315 266    BMET  Basename 09/02/11 0412  NA 136  K 4.0  CL 100  CO2 25  GLUCOSE 81  BUN 7  CREATININE 0.73  CALCIUM 8.8   PT/INR No results found for this basename: LABPROT:2,INR:2 in the last 72 hours   Lab 09/02/11 0412 08/28/11 0844  AST 22 11  ALT 20 10  ALKPHOS 77 77  BILITOT 0.4 1.0  PROT 6.9 8.1  ALBUMIN 2.7* 3.6     Lipase  No results found for this basename: lipase     Studies/Results: Ct Abdomen Pelvis W Contrast  09/02/2011  *RADIOLOGY REPORT*  Clinical Data: Sigmoid diverticulitis, lower abdominal pain  CT ABDOMEN AND PELVIS WITH CONTRAST  Technique:  Multidetector CT imaging of the abdomen and pelvis was performed following the standard protocol during bolus administration of intravenous contrast.  Contrast: OMNIPAQUE IOHEXOL 300 MG/ML  SOLN  Comparison: August 28, 2011  Findings: Evidence of sigmoid diverticulitis remains present with wall thickening, pericolonic stranding, and free fluid layering within the pelvis.  A large collection of free gas is present within the fat anterior and medial to the inflamed  loop of sigmoid colon (5.6 x 5.5 x 4.4 cm in the AP, transverse, and longitudinal dimensions.).  This has increased compared with the recent prior exam.  There is no associated fluid or soft tissue to suggest abscess or phlegmon formation at this time, however.  There is no evidence of bowel obstruction.  A fluid/ fluid level is noted within the gallbladder lumen which was not seen before and may represent vicarious excretion of contrast rather than biliary sludge.  The liver, spleen, pancreas, adrenal glands, kidneys, urinary bladder, uterus and adnexa have a normal appearance.  The lung bases are clear.  The osseous structures are unremarkable. No adenopathy.  IMPRESSION: Persistent evidence of perforated sigmoid diverticulitis with interval increase in amount of adjacent extraluminal gas.  Original Report Authenticated By: Brandon Melnick, M.D.    Medications:    . ertapenem (INVANZ) IV  1 g Intravenous Q24H  . heparin  5,000 Units Subcutaneous Q8H  . pantoprazole (PROTONIX) IV  40 mg Intravenous QHS    Assessment/Plan .Sigmoid diverticulitis with perforation.  2. Tobacco use  3. BMI 37   Plan:  Send home on Augmentin follow up 09/08/11 Dr. Donell Beers.    LOS: 6 days    Safir Michalec 09/03/2011

## 2011-09-03 NOTE — Progress Notes (Signed)
Seen and agree with above.  Follow up early next week.

## 2011-09-03 NOTE — Progress Notes (Signed)
Early follow up.

## 2011-09-08 NOTE — Discharge Summary (Signed)
Physician Discharge Summary   Patient ID:  Becky Beck  MRN: [id number]  DOB/AGE: May 31, 1975 36 y.o.  Admit date: 08/28/2011  Discharge date: 09/03/2011  Admission Diagnoses:  1. Sigmoid diverticulitis with perforation.  2. Tobacco use  3. BMI 37  Discharge Diagnoses: Same  Principal Problem:  *Diverticulitis of large intestine with microperforation  Active Problems:  Obesity, Class III, BMI 40-49.9 (morbid obesity)  Lymphadenopathy, mesenteric   PROCEDURES: None  Hospital Course: Patient's a 36 year old female who presents with onset of abdominal pain Wednesday morning he was in her groin and then moved her lower abdomen. She describes it as sharp was coming and going all day yesterday.  She presented to the emergency room and was treated. She developed nausea and vomiting this a.m. Pain was improved after medication in the ER. Her last bowel movement was 2 days ago.  There was no blood, she is normally quite regular. She does not complain of pain while lying still. She said it was very painful on the drive in this morning from home. She's not been able to eat for the last 24 hours. Labs on evaluation in the ER shows a white count of 23,000. H./H. stable. UA shows a large amount of blood; nitrates are negative, many bacteria. CT scan shows sigmoid diverticula stranding the pericolonic fat in the mid pelvis medial to the sigmoid colon above the bladder there are small pockets of extraluminal air noted within the mesentery adjacent to the sigmoid colon. Findings are consistent with localized perforated diverticulitis with no abscess noted. There is a short segment of thickened small bowel wall in the terminal small bowel mid pelvis adjacent to the sigmoid colon most likely inflammatory enteritis. We were asked by the emergency room to see the patient in consultation.  She was seen and admitted by Dr. Andrey Campanile. We placed her on IV antibiotics, and she's made good progress. He diet has been advanced  and her WBC improved. Repeat CT on 7/30 showed persistent evidence of perforation, with increased interval amount of gas extraluminal around her sigmoid colon. She was observed on a low fiber diet for another 24 hours. Her exam was quite normal and she really wanted to go home. She was placed on oral antibiotics and allowed to go home today. She will follow up with Dr. Donell Beers next week,in the office, this is being arranged by our office. We have talked about diet and discontinuation of tobacco.  Condition on D/C: Improved  Disposition: 01-Home or Self Care   Medication List  As of 09/03/2011 3:33 PM    STOP taking these medications          GOODYS EXTRA STRENGTH 520-260-32.5 MG Pack       TAKE these medications          acetaminophen 325 MG tablet      Commonly known as: TYLENOL      Take 2 tablets (650 mg total) by mouth every 6 (six) hours as needed (or Temp > 100).      amoxicillin-clavulanate 875-125 MG per tablet      Commonly known as: AUGMENTIN      Take 1 tablet by mouth every 12 (twelve) hours.      MIDOL COMPLETE 500-60-15 MG Tabs      Generic drug: Acetaminophen-Caff-Pyrilamine      Take 2 tablets by mouth every 6 (six) hours as needed. For pain      oxyCODONE-acetaminophen 5-325 MG per tablet      Commonly known  as: PERCOCET/ROXICET      Take 1 tablet by mouth every 4 (four) hours as needed (As needed for moderate pain).         Follow-up Information    Follow up with [facility], MD. Schedule an appointment as soon as possible for a visit in 1 week.    Contact information:    [address]  [address]  [address]  [place] [address]  [phone]         Signed:  Sherrie George  09/03/2011, 3:33 PM

## 2011-09-09 ENCOUNTER — Telehealth (INDEPENDENT_AMBULATORY_CARE_PROVIDER_SITE_OTHER): Payer: Self-pay

## 2011-09-09 NOTE — Telephone Encounter (Signed)
LMOV appt moved from 8/9 to 09/10/11 at 4:00pm per Dr. Donell Beers.

## 2011-09-10 ENCOUNTER — Encounter (INDEPENDENT_AMBULATORY_CARE_PROVIDER_SITE_OTHER): Payer: Self-pay | Admitting: General Surgery

## 2011-09-10 ENCOUNTER — Ambulatory Visit (INDEPENDENT_AMBULATORY_CARE_PROVIDER_SITE_OTHER): Payer: Self-pay | Admitting: General Surgery

## 2011-09-10 VITALS — BP 116/72 | HR 69 | Ht 71.0 in | Wt 255.0 lb

## 2011-09-10 DIAGNOSIS — K5720 Diverticulitis of large intestine with perforation and abscess without bleeding: Secondary | ICD-10-CM

## 2011-09-10 DIAGNOSIS — K5732 Diverticulitis of large intestine without perforation or abscess without bleeding: Secondary | ICD-10-CM

## 2011-09-10 MED ORDER — CIPROFLOXACIN HCL 750 MG PO TABS: 750.0000 mg | ORAL_TABLET | Freq: Two times a day (BID) | ORAL | Status: AC

## 2011-09-10 MED ORDER — METRONIDAZOLE 500 MG PO TABS: 500.0000 mg | ORAL_TABLET | Freq: Three times a day (TID) | ORAL | Status: AC

## 2011-09-10 NOTE — Assessment & Plan Note (Signed)
Patient states that she feels better, however she is still taking narcotics in addition to her antibiotics. This makes me concerned that she may be masking an abscess or continued inflammation.  I will reorder a CT scan next week.  I am not refilling her pain medications.  In switching her from Augmentin to Cipro and Flagyl. In any event, if she remains on antibiotics, she will eventually need surgery.  I will follow her up in 2 weeks. I advised her thatif  she has fevers, chills, or sudden onset of abdominal pain that she should go to the emergency department.

## 2011-09-10 NOTE — Patient Instructions (Addendum)
Wean off pain medications.  Follow up CT next week.  Follow up with me in 2 weeks.  UNLESS YOU HAVE FEVER, OR ACUTELY WORSENING PAIN. In that case, call earlier.     Don't eat or drink anything 4 hours before the CT scan.  Drink the first bottle of contrast 2 hours before the test and drink the second bottle 1 hour before the test.

## 2011-09-10 NOTE — Progress Notes (Signed)
HISTORY: Patient is a 36 year old female that I saw in the hospital last week for diverticulitis with microperforation.  Her repeat CAT scan demonstrated additional air around her colon. However, she clinically was feeling much better.  She went home on antibiotics.  She has been taking 2 Percocet around every 4-6 hours. She states that she still is feeling better even than last week.  She denies nausea, vomiting, fever, chills, night sweats. She is on Augmentin. She is not having diarrhea. She describes the pain as she is having as coming on after she eats and when she has a bowel movement.   PERTINENT REVIEW OF SYSTEMS: Otherwise negative.     EXAM: Head: Normocephalic and atraumatic.  Eyes:  Conjunctivae are normal. Pupils are equal, round, and reactive to light. No scleral icterus.  Neck:  Normal range of motion. Neck supple. No tracheal deviation present. No thyromegaly present.  Resp: No respiratory distress, normal effort. Abd:  Abdomen is soft, non distended.  Mild tenderness in suprapubic region an LLQ.  No masses are palpable.  There is no rebound and no guarding.  Neurological: Alert and oriented to person, place, and time. Coordination normal.  Skin: Skin is warm and dry. No rash noted. No diaphoretic. No erythema. No pallor.  Psychiatric: Normal mood and affect. Normal behavior. Judgment and thought content normal.      ASSESSMENT AND PLAN:   Diverticulitis of large intestine with perforation Patient states that she feels better, however she is still taking narcotics in addition to her antibiotics. This makes me concerned that she may be masking an abscess or continued inflammation.  I will reorder a CT scan next week.  I am not refilling her pain medications.  In switching her from Augmentin to Cipro and Flagyl. In any event, if she remains on antibiotics, she will eventually need surgery.  I will follow her up in 2 weeks. I advised her thatif  she has fevers, chills, or  sudden onset of abdominal pain that she should go to the emergency department.      Maudry Diego, MD Surgical Oncology, General & Endocrine Surgery [facility]  No primary provider on file. No ref. provider found

## 2011-09-12 ENCOUNTER — Encounter (INDEPENDENT_AMBULATORY_CARE_PROVIDER_SITE_OTHER): Payer: Self-pay | Admitting: General Surgery

## 2011-09-17 ENCOUNTER — Other Ambulatory Visit: Payer: Self-pay

## 2011-09-26 ENCOUNTER — Encounter (INDEPENDENT_AMBULATORY_CARE_PROVIDER_SITE_OTHER): Payer: Self-pay | Admitting: General Surgery

## 2013-11-04 ENCOUNTER — Ambulatory Visit (INDEPENDENT_AMBULATORY_CARE_PROVIDER_SITE_OTHER): Payer: Commercial Indemnity | Admitting: Physician Assistant

## 2013-11-04 VITALS — BP 132/80 | HR 65 | Temp 97.6°F | Resp 18 | Ht 70.0 in | Wt 237.0 lb

## 2013-11-04 DIAGNOSIS — R05 Cough: Secondary | ICD-10-CM

## 2013-11-04 DIAGNOSIS — J019 Acute sinusitis, unspecified: Secondary | ICD-10-CM

## 2013-11-04 DIAGNOSIS — R059 Cough, unspecified: Secondary | ICD-10-CM

## 2013-11-04 MED ORDER — IPRATROPIUM BROMIDE 0.03 % NA SOLN: 2.0000 | Freq: Two times a day (BID) | NASAL | Status: DC

## 2013-11-04 MED ORDER — BENZONATATE 100 MG PO CAPS: 100.0000 mg | ORAL_CAPSULE | Freq: Three times a day (TID) | ORAL | Status: DC | PRN

## 2013-11-04 MED ORDER — AMOXICILLIN 875 MG PO TABS: 1750.0000 mg | ORAL_TABLET | Freq: Two times a day (BID) | ORAL | Status: DC

## 2013-11-04 NOTE — Progress Notes (Signed)
   Subjective:    Patient ID: Becky Beck, female    DOB: 08/19/1975, 38 y.o.   MRN: [id number]  Sinus Problem Associated symptoms include coughing, sinus pressure and sneezing. Pertinent negatives include no chills, ear pain (Ear pressure) or sore throat.  Otalgia  Associated symptoms include coughing. Pertinent negatives include no diarrhea or sore throat.  Cough Pertinent negatives include no chills, ear pain (Ear pressure), fever or sore throat.   38 year old female with PMH of headaches is here today for chief complaint of cough.  She has experienced 11 days of sneezing, watery eyes, and itchy throat.  She took Claritin for what she thought was allergies.  8 days passed with improvement, but then worsened over the last 2 days with nasal congestion, and a sensation of eyes and ear pressure.  She denies fever, nausea, vomiting, diarrhea.       Review of Systems  Constitutional: Negative for fever and chills.  HENT: Positive for sinus pressure and sneezing. Negative for ear pain (Ear pressure) and sore throat.   Eyes: Positive for itching.  Respiratory: Positive for cough.   Gastrointestinal: Negative for nausea and diarrhea.       Objective:   Physical Exam  Constitutional: She is oriented to person, place, and time. She appears well-developed and well-nourished.  HENT:  Head: Normocephalic.  Right Ear: Tympanic membrane is erythematous (mildly erythematous).  Nose: Mucosal edema and rhinorrhea present. Right sinus exhibits no maxillary sinus tenderness and no frontal sinus tenderness (Pressure wth leaning forward). Left sinus exhibits no maxillary sinus tenderness and no frontal sinus tenderness (Pressure wth leaning forward).  Mouth/Throat: No posterior oropharyngeal erythema.  Eyes: Conjunctivae are normal. Right eye exhibits no discharge. Left eye exhibits no discharge.  "allergic shiner's"  Lymphadenopathy:       Head (right side): No submandibular, no preauricular, no  posterior auricular and no occipital adenopathy present.       Head (left side): No submandibular, no preauricular, no posterior auricular and no occipital adenopathy present.       Right cervical: No posterior cervical adenopathy present.      Left cervical: No posterior cervical adenopathy present.       Right: No supraclavicular adenopathy present.       Left: No supraclavicular adenopathy present.  Neurological: She is alert and oriented to person, place, and time.      Assessment & Plan:  This is a 38 year old female here with a chief complaint of coughing. Subacute sinusitis, unspecified location - Plan: amoxicillin (AMOXIL) 875 MG tablet, ipratropium (ATROVENT) 0.03 % nasal spray  Cough - Plan: benzonatate (TESSALON) 100 MG capsule, ipratropium (ATROVENT) 0.03 % nasal spray  Trena PlattStephanie English, PA-C [facility] 10/2/20154:04 PM

## 2013-11-04 NOTE — Patient Instructions (Signed)
Get rest and stay hydrated. Return if symptoms worsen with antibiotics.

## 2013-11-05 NOTE — Progress Notes (Signed)
I have examined this patient along with Ms. English, PA-C and agree.  Lung exam is unremarkable-normal effort and breath sounds clear to auscultation bilaterally.

## 2014-06-29 ENCOUNTER — Ambulatory Visit: Payer: Self-pay | Admitting: Internal Medicine

## 2014-09-25 ENCOUNTER — Ambulatory Visit (INDEPENDENT_AMBULATORY_CARE_PROVIDER_SITE_OTHER): Payer: Commercial Indemnity | Admitting: Physician Assistant

## 2014-09-25 VITALS — BP 118/72 | HR 64 | Temp 98.7°F | Resp 16 | Ht 71.0 in | Wt 244.0 lb

## 2014-09-25 DIAGNOSIS — M722 Plantar fascial fibromatosis: Secondary | ICD-10-CM

## 2014-09-25 MED ORDER — MELOXICAM 15 MG PO TABS: 15.0000 mg | ORAL_TABLET | Freq: Every day | ORAL | Status: DC

## 2014-09-25 NOTE — Progress Notes (Signed)
[facility] [address] [place] [address] [phone]  Date:  09/25/2014   Name:  Becky Beck   DOB:  02-05-1975   MRN:  [id number]  PCP:  No PCP Per Patient    Chief Complaint: Foot Pain   History of Present Illness:  This is a 39 y.o. female with PMH obesity who is presenting with right foot pain x 3 days. States she had similar pain 3 months ago but resolved after a few days. She bought shoe inserts for her steel toe boots that she wears at work and this helped. 3 days ago she woke with similar pain but much worse. No known injury. Pain located to heel and arch of foot. Pain worse with first steps of the day and gets better as the day goes on. She tried advil once and helped some but not taking anything consistently. She denies weakness or paresthesias.  Review of Systems:  Review of Systems See HPI  Patient Active Problem List   Diagnosis Date Noted  . Obesity, Class III, BMI 40-49.9 (morbid obesity) 08/29/2011  . Diverticulitis of large intestine with perforation 08/29/2011  . Lymphadenopathy, mesenteric 08/29/2011    Prior to Admission medications   Not on File    No Known Allergies  History reviewed. No pertinent past surgical history.  Social History  Substance Use Topics  . Smoking status: Current Every Day Smoker -- 1.00 packs/day for 17 years    Types: Cigarettes  . Smokeless tobacco: Never Used  . Alcohol Use: No    Family History  Problem Relation Age of Onset  . Cancer Mother     colon  . Heart disease Father   . Hyperlipidemia Father   . Hypertension Father   . Diabetes Father   . Asthma Father     Medication list has been reviewed and updated.  Physical Examination:  Physical Exam  Constitutional: She is oriented to person, place, and time. She appears well-developed and well-nourished. No distress.  HENT:  Head: Normocephalic and atraumatic.  Right Ear: Hearing normal.  Left Ear: Hearing normal.  Nose:  Nose normal.  Eyes: Conjunctivae and lids are normal. Right eye exhibits no discharge. Left eye exhibits no discharge. No scleral icterus.  Cardiovascular: Normal rate, regular rhythm and normal pulses.   Pulmonary/Chest: Effort normal. No respiratory distress.  Musculoskeletal: Normal range of motion.       Right ankle: Normal. She exhibits normal range of motion. No tenderness. No lateral malleolus and no medial malleolus tenderness found. Achilles tendon normal.       Left ankle: Normal. She exhibits normal range of motion and no swelling. No lateral malleolus and no medial malleolus tenderness found. Achilles tendon normal.       Right foot: There is tenderness (plantar aspect of calcaneus, near insertion of plantar fascia with calcaneus. ). There is normal range of motion, no swelling and normal capillary refill.       Left foot: Normal.  No tenderness along arch of right foot. No pain with heel squeeze  Neurological: She is alert and oriented to person, place, and time. No sensory deficit. Gait (mildly antalgic) abnormal.  Skin: Skin is warm, dry and intact. No lesion and no rash noted.  Psychiatric: She has a normal mood and affect. Her speech is normal and behavior is normal. Thought content normal.   BP 118/72 mmHg  Pulse 64  Temp(Src) 98.7 F (37.1 C) (Oral)  Resp 16  Ht   (1.803 m)  Wt 244 lb (110.678 kg)  BMI 34.05 kg/m2  SpO2 99%  LMP 09/11/2014  Assessment and Plan:  1. Plantar fasciitis of right foot Patient opted for presumptive treatment of plantar fasciitis without obtaining radiograph. Discussed silicone heel inserts, stretches before getting out of bed and during the day, wearing only supportive shoes and avoiding walking barefoot, applying ice. Prescribed mobic to take daily for next 2 weeks. If not better in 4-6 months return. - meloxicam (MOBIC) 15 MG tablet; Take 1 tablet (15 mg total) by mouth daily.  Dispense: 30 tablet; Refill: 0   [employer] [employer]. Dyke Brackett, MHS [facility]  09/26/2014

## 2014-09-25 NOTE — Patient Instructions (Addendum)
Silicone heel inserts can help. Take mobic once a day for next 2 weeks. Do not use Mobic with other products containing ibuprofen, naprosyn or aspirin. You may use tylenol with this medication. Do stretches in bed before walking. First step out of bed should be in a supportive shoe. Do not walk barefoot or wear slipper or flip flops.   Plantar Fasciitis Plantar fasciitis is a common condition that causes foot pain. It is soreness (inflammation) of the band of tough fibrous tissue on the bottom of the foot that runs from the heel bone (calcaneus) to the ball of the foot. The cause of this soreness may be from excessive standing, poor fitting shoes, running on hard surfaces, being overweight, having an abnormal walk, or overuse (this is common in runners) of the painful foot or feet. It is also common in aerobic exercise dancers and ballet dancers. SYMPTOMS  Most people with plantar fasciitis complain of:  Severe pain in the morning on the bottom of their foot especially when taking the first steps out of bed. This pain recedes after a few minutes of walking.  Severe pain is experienced also during walking following a long period of inactivity.  Pain is worse when walking barefoot or up stairs DIAGNOSIS   Your caregiver will diagnose this condition by examining and feeling your foot.  Special tests such as X-rays of your foot, are usually not needed. PREVENTION   Consult a sports medicine professional before beginning a new exercise program.  Walking programs offer a good workout. With walking there is a lower chance of overuse injuries common to runners. There is less impact and less jarring of the joints.  Begin all new exercise programs slowly. If problems or pain develop, decrease the amount of time or distance until you are at a comfortable level.  Wear good shoes and replace them regularly.  Stretch your foot and the heel cords at the back of the ankle (Achilles tendon) both  before and after exercise.  Run or exercise on even surfaces that are not hard. For example, asphalt is better than pavement.  Do not run barefoot on hard surfaces.  If using a treadmill, vary the incline.  Do not continue to workout if you have foot or joint problems. Seek professional help if they do not improve. HOME CARE INSTRUCTIONS   Avoid activities that cause you pain until you recover.  Use ice or cold packs on the problem or painful areas after working out.  Only take over-the-counter or prescription medicines for pain, discomfort, or fever as directed by your caregiver.  Soft shoe inserts or athletic shoes with air or gel sole cushions may be helpful.  If problems continue or become more severe, consult a sports medicine caregiver or your own health care provider. Cortisone is a potent anti-inflammatory medication that may be injected into the painful area. You can discuss this treatment with your caregiver. MAKE SURE YOU:   Understand these instructions.  Will watch your condition.  Will get help right away if you are not doing well or get worse. Document Released: 10/15/2000 Document Revised: 04/14/2011 Document Reviewed: 12/15/2007 [facility] Patient Information 2015 [place], [place]. This information is not intended to replace advice given to you by your health care provider. Make sure you discuss any questions you have with your health care provider.

## 2014-09-26 DIAGNOSIS — M722 Plantar fascial fibromatosis: Secondary | ICD-10-CM | POA: Insufficient documentation

## 2014-12-27 ENCOUNTER — Other Ambulatory Visit (INDEPENDENT_AMBULATORY_CARE_PROVIDER_SITE_OTHER): Payer: 59

## 2014-12-27 ENCOUNTER — Ambulatory Visit (INDEPENDENT_AMBULATORY_CARE_PROVIDER_SITE_OTHER): Payer: 59 | Admitting: Internal Medicine

## 2014-12-27 ENCOUNTER — Encounter: Payer: Self-pay | Admitting: Internal Medicine

## 2014-12-27 VITALS — BP 110/80 | HR 70 | Temp 98.4°F | Resp 16 | Ht 72.0 in | Wt 244.0 lb

## 2014-12-27 DIAGNOSIS — Z800 Family history of malignant neoplasm of digestive organs: Secondary | ICD-10-CM | POA: Diagnosis not present

## 2014-12-27 DIAGNOSIS — Z0000 Encounter for general adult medical examination without abnormal findings: Secondary | ICD-10-CM | POA: Diagnosis not present

## 2014-12-27 DIAGNOSIS — G5603 Carpal tunnel syndrome, bilateral upper limbs: Secondary | ICD-10-CM

## 2014-12-27 DIAGNOSIS — F329 Major depressive disorder, single episode, unspecified: Secondary | ICD-10-CM | POA: Insufficient documentation

## 2014-12-27 DIAGNOSIS — G5600 Carpal tunnel syndrome, unspecified upper limb: Secondary | ICD-10-CM | POA: Insufficient documentation

## 2014-12-27 DIAGNOSIS — F322 Major depressive disorder, single episode, severe without psychotic features: Secondary | ICD-10-CM

## 2014-12-27 DIAGNOSIS — E669 Obesity, unspecified: Secondary | ICD-10-CM

## 2014-12-27 LAB — CBC
HCT: 43.6 % (ref 36.0–46.0)
Hemoglobin: 14.7 g/dL (ref 12.0–15.0)
MCHC: 33.8 g/dL (ref 30.0–36.0)
MCV: 91.1 fl (ref 78.0–100.0)
PLATELETS: 230 10*3/uL (ref 150.0–400.0)
RBC: 4.78 Mil/uL (ref 3.87–5.11)
RDW: 13.2 % (ref 11.5–15.5)
WBC: 10.4 10*3/uL (ref 4.0–10.5)

## 2014-12-27 LAB — COMPREHENSIVE METABOLIC PANEL
ALK PHOS: 57 U/L (ref 39–117)
ALT: 20 U/L (ref 0–35)
AST: 26 U/L (ref 0–37)
Albumin: 4 g/dL (ref 3.5–5.2)
BILIRUBIN TOTAL: 0.4 mg/dL (ref 0.2–1.2)
BUN: 20 mg/dL (ref 6–23)
CO2: 24 mEq/L (ref 19–32)
CREATININE: 0.86 mg/dL (ref 0.40–1.20)
Calcium: 8.8 mg/dL (ref 8.4–10.5)
Chloride: 107 mEq/L (ref 96–112)
GFR: 77.59 mL/min (ref >60.00)
GLUCOSE: 86 mg/dL (ref 70–99)
Potassium: 4.8 mEq/L (ref 3.5–5.1)
Sodium: 139 mEq/L (ref 135–145)
TOTAL PROTEIN: 7.2 g/dL (ref 6.0–8.3)

## 2014-12-27 LAB — LIPID PANEL
CHOL/HDL RATIO: 4
Cholesterol: 173 mg/dL (ref 0–200)
HDL: 47.4 mg/dL (ref >39.00)
LDL Cholesterol: 112 mg/dL — ABNORMAL HIGH (ref 0–99)
NONHDL: 125.95
TRIGLYCERIDES: 70 mg/dL (ref 0.0–149.0)
VLDL: 14 mg/dL (ref 0.0–40.0)

## 2014-12-27 MED ORDER — FLUOXETINE HCL 20 MG PO TABS: 20.0000 mg | ORAL_TABLET | Freq: Every day | ORAL | Status: DC

## 2014-12-27 NOTE — Assessment & Plan Note (Signed)
She does likely have carpal tunnel bilaterally. Advised to try wrist splints and if no improvement the next step would be hand surgeon.

## 2014-12-27 NOTE — Assessment & Plan Note (Signed)
Her mother did pass at 143 from colon cancer and she has not had screening. Checking CBC today and tried to talk to her about colonoscopy and she will think about it. Will readdress at next visit.

## 2014-12-27 NOTE — Assessment & Plan Note (Signed)
Will start fluoxetine for her depression and mild anxiety. I think that we need to get this under control so she can face colonoscopy.

## 2014-12-27 NOTE — Progress Notes (Signed)
Pre visit review using our clinic review tool, if applicable. No additional management support is needed unless otherwise documented below in the visit note. 

## 2014-12-27 NOTE — Patient Instructions (Signed)
We have sent in a medicine to help with the worry and stress called fluoxetine. It is a medicine that you take daily while you need it to help calm down the mood. It can sometimes take 4-6 weeks to get into the blood stream and help. Sometimes the starting dose is not enough to help everyone so we may need to change the dose.   It is important to think about doing the colon test to check for the diverticulitits.   For the hands you can try wrist splints over the counter to sleep in. If that does not help the next step is to see a hand doctor that can take care of the problem.   We are checking the blood work today and will call you back about the results even if everything is normal.   Carpal Tunnel Syndrome Carpal tunnel syndrome is a condition that causes pain in your hand and arm. The carpal tunnel is a narrow area located on the palm side of your wrist. Repeated wrist motion or certain diseases may cause swelling within the tunnel. This swelling pinches the main nerve in the wrist (median nerve). CAUSES  This condition may be caused by:   Repeated wrist motions.  Wrist injuries.  Arthritis.  A cyst or tumor in the carpal tunnel.  Fluid buildup during pregnancy. Sometimes the cause of this condition is not known.  RISK FACTORS This condition is more likely to develop in:   People who have jobs that cause them to repeatedly move their wrists in the same motion, such as butchers and cashiers.  Women.  People with certain conditions, such as:  Diabetes.  Obesity.  An underactive thyroid (hypothyroidism).  Kidney failure. SYMPTOMS  Symptoms of this condition include:   A tingling feeling in your fingers, especially in your thumb, index, and middle fingers.  Tingling or numbness in your hand.  An aching feeling in your entire arm, especially when your wrist and elbow are bent for long periods of time.  Wrist pain that goes up your arm to your shoulder.  Pain that goes  down into your palm or fingers.  A weak feeling in your hands. You may have trouble grabbing and holding items. Your symptoms may feel worse during the night.  DIAGNOSIS  This condition is diagnosed with a medical history and physical exam. You may also have tests, including:   An electromyogram (EMG). This test measures electrical signals sent by your nerves into the muscles.  X-rays. TREATMENT  Treatment for this condition includes:  Lifestyle changes. It is important to stop doing or modify the activity that caused your condition.  Physical or occupational therapy.  Medicines for pain and inflammation. This may include medicine that is injected into your wrist.  A wrist splint.  Surgery. HOME CARE INSTRUCTIONS  If You Have a Splint:  Wear it as told by your health care provider. Remove it only as told by your health care provider.  Loosen the splint if your fingers become numb and tingle, or if they turn cold and blue.  Keep the splint clean and dry. General Instructions  Take over-the-counter and prescription medicines only as told by your health care provider.  Rest your wrist from any activity that may be causing your pain. If your condition is work related, talk to your employer about changes that can be made, such as getting a wrist pad to use while typing.  If directed, apply ice to the painful area:  Put  ice in a plastic bag.  Place a towel between your skin and the bag.  Leave the ice on for 20 minutes, 2-3 times per day.  Keep all follow-up visits as told by your health care provider. This is important.  Do any exercises as told by your health care provider, physical therapist, or occupational therapist. SEEK MEDICAL CARE IF:   You have new symptoms.  Your pain is not controlled with medicines.  Your symptoms get worse.   This information is not intended to replace advice given to you by your health care provider. Make sure you discuss any questions  you have with your health care provider.   Document Released: 01/18/2000 Document Revised: 10/11/2014 Document Reviewed: 06/07/2014 Elsevier Interactive Patient Education [employer]

## 2014-12-27 NOTE — Assessment & Plan Note (Signed)
Not able to address well today, she does want to lose weight and will work on her diet.

## 2014-12-27 NOTE — Progress Notes (Signed)
   Subjective:    Patient ID: Becky Beck, female    DOB: 10/17/1975, 39 y.o.   MRN: [id number]  HPI The patient is a new 39 YO female coming in with several concerns. She is having a lot of numbness in her hands, did a lot of repetitive motions in her work for many years. Does wake up with numbness and it goes away. Is impacting her life.  Next concern is that her mother passed with colon cancer in her low 8340s and she has not been tested. She also had diverticulitis several years ago and did not have colonoscopy after due to lack of insurance. Denies blood in her bowel movements. Next concern is that she is under an extreme amount of stress and is having some depression. She has tried to deal with it on her own but she does have a limited support network recently. She also has an extreme fear of the medical community and doctors in general and was very anxious about coming here today.   [facility], social history reviewed and updated.   Review of Systems  Constitutional: Negative for fever, activity change and appetite change.  HENT: Negative.   Eyes: Negative.   Respiratory: Negative for cough, chest tightness and shortness of breath.   Cardiovascular: Negative for chest pain, palpitations and leg swelling.  Gastrointestinal: Negative for nausea, abdominal pain, diarrhea, constipation and abdominal distention.  Musculoskeletal: Positive for arthralgias. Negative for myalgias and back pain.  Skin: Negative.   Neurological: Positive for numbness. Negative for dizziness, light-headedness and headaches.  Psychiatric/Behavioral: Positive for dysphoric mood and decreased concentration. Negative for behavioral problems and sleep disturbance. The patient is not nervous/anxious.       Objective:   Physical Exam  Constitutional: She is oriented to person, place, and time. She appears well-developed and well-nourished.  HENT:  Head: Normocephalic and atraumatic.  Eyes: EOM are normal.  Neck:  Normal range of motion.  Cardiovascular: Normal rate and regular rhythm.   Pulmonary/Chest: Effort normal and breath sounds normal. No respiratory distress. She has no wheezes. She has no rales.  Abdominal: Soft. Bowel sounds are normal. She exhibits no distension. There is no tenderness. There is no rebound.  Musculoskeletal:  Positive tinnel sign  Neurological: She is alert and oriented to person, place, and time.  Skin: Skin is warm and dry.  Psychiatric:  Anxious during our visit   Filed Vitals:   12/27/14 1009  BP: 110/80  Pulse: 70  Temp: 98.4 F (36.9 C)  TempSrc: Oral  Resp: 16  Height: 6' (1.829 m)  Weight: 244 lb (110.678 kg)  SpO2: 98%      Assessment & Plan:

## 2015-01-08 ENCOUNTER — Encounter (HOSPITAL_COMMUNITY): Payer: Self-pay | Admitting: Emergency Medicine

## 2015-01-08 ENCOUNTER — Emergency Department (HOSPITAL_COMMUNITY)
Admission: EM | Admit: 2015-01-08 | Discharge: 2015-01-08 | Disposition: A | Discharge status: Home / Self Care | Payer: 59 | Attending: Emergency Medicine | Admitting: Emergency Medicine

## 2015-01-08 DIAGNOSIS — G5602 Carpal tunnel syndrome, left upper limb: Secondary | ICD-10-CM | POA: Insufficient documentation

## 2015-01-08 DIAGNOSIS — Z8719 Personal history of other diseases of the digestive system: Secondary | ICD-10-CM | POA: Insufficient documentation

## 2015-01-08 DIAGNOSIS — F17210 Nicotine dependence, cigarettes, uncomplicated: Secondary | ICD-10-CM | POA: Diagnosis not present

## 2015-01-08 DIAGNOSIS — F329 Major depressive disorder, single episode, unspecified: Secondary | ICD-10-CM | POA: Insufficient documentation

## 2015-01-08 DIAGNOSIS — R2232 Localized swelling, mass and lump, left upper limb: Secondary | ICD-10-CM | POA: Diagnosis present

## 2015-01-08 DIAGNOSIS — Z79899 Other long term (current) drug therapy: Secondary | ICD-10-CM | POA: Insufficient documentation

## 2015-01-08 MED ORDER — OXYCODONE-ACETAMINOPHEN 5-325 MG PO TABS: 1.0000 | ORAL_TABLET | Freq: Four times a day (QID) | ORAL | Status: DC | PRN

## 2015-01-08 MED ORDER — PREDNISONE 20 MG PO TABS: 40.0000 mg | ORAL_TABLET | Freq: Every day | ORAL | Status: DC

## 2015-01-08 NOTE — ED Provider Notes (Signed)
CSN: [id number]     Arrival date & time 01/08/15  1212 History   First MD Initiated Contact with Patient 01/08/15 1305     Chief Complaint  Patient presents with  . Joint Swelling    left hand     (Consider location/radiation/quality/duration/timing/severity/associated sxs/prior Treatment) The history is provided by the patient.   patient presents with pain and swelling in her left hand. Began a couple days ago. Has reported history of carpal tunnel syndrome. States she had to unload some boxes at work. Has not had pain like this before. States her some tingling in her fingers. No fall or other severe trauma. No chest pain or difficulty breathing. Patient has difficulty closing her hand. The pain does go up her forearm.  Past Medical History  Diagnosis Date  . Headache(784.0)   . Depression   . Diverticulitis    History reviewed. No pertinent past surgical history. Family History  Problem Relation Age of Onset  . Cancer Mother     colon  . Heart disease Father   . Hyperlipidemia Father   . Hypertension Father   . Diabetes Father   . Asthma Father    Social History  Substance Use Topics  . Smoking status: Current Every Day Smoker -- 1.00 packs/day for 17 years    Types: Cigarettes  . Smokeless tobacco: Never Used  . Alcohol Use: No   OB History    No data available     Review of Systems  Constitutional: Negative for appetite change.  Respiratory: Negative for shortness of breath.   Cardiovascular: Negative for chest pain.  Musculoskeletal:       Left wrist pain and left hand pain and swelling.  Skin: Positive for color change. Negative for wound.  Neurological: Positive for numbness. Negative for weakness.      Allergies  Review of patient's allergies indicates no known allergies.  Home Medications   Prior to Admission medications   Medication Sig Start Date End Date Taking? Authorizing Provider  Aspirin-Acetaminophen-Caffeine (GOODY HEADACHE PO) Take 1  Package by mouth 4 (four) times daily as needed (pain). Four daily for headaches and foot pain and mouth pain   Yes Historical Provider, MD  FLUoxetine (PROZAC) 20 MG tablet Take 1 tablet (20 mg total) by mouth daily. 12/27/14  Yes Myrlene Broker, MD  oxyCODONE-acetaminophen (PERCOCET/ROXICET) 5-325 MG tablet Take 1-2 tablets by mouth every 6 (six) hours as needed for severe pain. 01/08/15   Benjiman Core, MD  predniSONE (DELTASONE) 20 MG tablet Take 2 tablets (40 mg total) by mouth daily. 01/08/15   Benjiman Core, MD   BP 111/68 mmHg  Pulse 56  Temp(Src) 98.2 F (36.8 C) (Temporal)  Resp 18  SpO2 98%  LMP 12/13/2014 (Exact Date) Physical Exam  Constitutional: She appears well-developed.  Cardiovascular: Normal rate.   Musculoskeletal: She exhibits tenderness.  Tenderness with some swelling to left wrist on the palmar aspect. Also swelling of the left hand. Paresthesias and tenderness over the ulnar distribution of the left hand. Some pain with flexion of the hand and fingers. Moderate pain with flexion of the wrist.  radial pulse intact.  Skin: Skin is warm.    ED Course  Procedures (including critical care time) Labs Review Labs Reviewed - No data to display  Imaging Review No results found. I have personally reviewed and evaluated these images and lab results as part of my medical decision-making.   EKG Interpretation None  [id number]    MDM  Final diagnoses:  Carpal tunnel syndrome of left wrist    Patient with likely severe carpal tunnel syndrome. We'll give splint and sterile it's and follow-up with hand surgery. Doubt infection or DVT.    Benjiman CoreNathan Refugio Vandevoorde, MD 01/08/15 [phone]

## 2015-01-08 NOTE — Discharge Instructions (Signed)

## 2015-01-08 NOTE — ED Notes (Signed)
Pt reports left hand swelling starting yesterday and into this morning. Denies injury/trauma but says she did unload a truck at [employer] where she works. Says, "I didn't get any sleep it was hurting so bad. I've been told I have carpel tunnel in both wrists." Took Goody Powder without alleviation of symptoms today. No other c/c.

## 2015-01-09 ENCOUNTER — Telehealth: Payer: Self-pay | Admitting: Internal Medicine

## 2015-01-09 DIAGNOSIS — G5603 Carpal tunnel syndrome, bilateral upper limbs: Secondary | ICD-10-CM

## 2015-01-09 NOTE — Telephone Encounter (Signed)
Pt called in and said that she had to go to the er about her carpal tunnel.  She would like a referral to a dr that can help her with that.     Best number is [phone]

## 2015-01-09 NOTE — Telephone Encounter (Signed)
Notified pt referral has been placed../lmb 

## 2015-01-09 NOTE — Telephone Encounter (Signed)
Referral placed to hand surgeon and she will get a call back about that.

## 2015-03-29 ENCOUNTER — Ambulatory Visit: Payer: 59 | Admitting: Internal Medicine

## 2015-04-04 ENCOUNTER — Ambulatory Visit (INDEPENDENT_AMBULATORY_CARE_PROVIDER_SITE_OTHER): Payer: Managed Care, Other (non HMO) | Admitting: Internal Medicine

## 2015-04-04 ENCOUNTER — Encounter: Payer: Self-pay | Admitting: Internal Medicine

## 2015-04-04 VITALS — BP 128/82 | HR 86 | Temp 99.1°F | Resp 14 | Wt 242.0 lb

## 2015-04-04 DIAGNOSIS — G5603 Carpal tunnel syndrome, bilateral upper limbs: Secondary | ICD-10-CM | POA: Diagnosis not present

## 2015-04-04 DIAGNOSIS — F322 Major depressive disorder, single episode, severe without psychotic features: Secondary | ICD-10-CM | POA: Diagnosis not present

## 2015-04-04 MED ORDER — GABAPENTIN 300 MG PO CAPS: 300.0000 mg | ORAL_CAPSULE | Freq: Three times a day (TID) | ORAL | Status: DC

## 2015-04-04 MED ORDER — FLUOXETINE HCL 20 MG PO TABS: 40.0000 mg | ORAL_TABLET | Freq: Every day | ORAL | Status: DC

## 2015-04-04 NOTE — Progress Notes (Signed)
Pre visit review using our clinic review tool, if applicable. No additional management support is needed unless otherwise documented below in the visit note. 

## 2015-04-04 NOTE — Patient Instructions (Signed)
We have sent in the new prescription for the prozac (fluoxetine) that you will take now 2 pills per day to help more. If this is too strong go back down to 1 pill daily.   We have sent in a medicine for the pain called gabapentin. Take 1 pill in the evening the first 3 days, then you can increase to 1 pill twice a day if needed for 3 days, then you can increase to 1 pill 3 times per day for pain.   Remember that it would be a really good idea for you to get a colonoscopy soon with your mother's history of colon troubles.

## 2015-04-06 NOTE — Assessment & Plan Note (Signed)
Much improved on the prozac and will increase to 40 mg daily to see if this helps a little more.

## 2015-04-06 NOTE — Assessment & Plan Note (Signed)
Rx for gabapentin for pain and talked to her again about night time splinting. She will continue to work towards getting surgery for more permanent solution.

## 2015-04-06 NOTE — Progress Notes (Signed)
   Subjective:    Patient ID: Becky Beck, female    DOB: 10/25/1975, 40 y.o.   MRN: [id number]  HPI The patient is a 40 YO female coming in for carpal tunnel syndrome. She is having a lot of pain and numbness. Both hands but left more severe. [facility] and had testing and they have offered surgery which she cannot afford right now. She needs something for the pain so she can keep working.   Review of Systems  Constitutional: Negative for fever, activity change and appetite change.  HENT: Negative.   Eyes: Negative.   Respiratory: Negative for cough, chest tightness and shortness of breath.   Cardiovascular: Negative for chest pain, palpitations and leg swelling.  Gastrointestinal: Negative for nausea, abdominal pain, diarrhea, constipation and abdominal distention.  Musculoskeletal: Positive for arthralgias. Negative for myalgias and back pain.  Skin: Negative.   Neurological: Positive for numbness. Negative for dizziness, light-headedness and headaches.  Psychiatric/Behavioral: Positive for dysphoric mood and decreased concentration. Negative for behavioral problems and sleep disturbance. The patient is not nervous/anxious.       Objective:   Physical Exam  Constitutional: She is oriented to person, place, and time. She appears well-developed and well-nourished.  HENT:  Head: Normocephalic and atraumatic.  Eyes: EOM are normal.  Neck: Normal range of motion.  Cardiovascular: Normal rate and regular rhythm.   Pulmonary/Chest: Effort normal and breath sounds normal. No respiratory distress. She has no wheezes. She has no rales.  Abdominal: Soft. Bowel sounds are normal. She exhibits no distension. There is no tenderness. There is no rebound.  Musculoskeletal:  Positive tinnel sign  Neurological: She is alert and oriented to person, place, and time.  Skin: Skin is warm and dry.  Psychiatric:  Less anxious today   Filed Vitals:   04/04/15 0950  BP: 128/82  Pulse: 86    Temp: 99.1 F (37.3 C)  TempSrc: Oral  Resp: 14  Weight: 242 lb (109.77 kg)  SpO2: 98%      Assessment & Plan:

## 2015-04-09 ENCOUNTER — Encounter: Payer: Self-pay | Admitting: Internal Medicine

## 2015-04-10 ENCOUNTER — Encounter: Payer: Self-pay | Admitting: Internal Medicine

## 2015-04-10 MED ORDER — MELOXICAM 7.5 MG PO TABS: 7.5000 mg | ORAL_TABLET | Freq: Every day | ORAL | Status: DC

## 2015-05-09 ENCOUNTER — Other Ambulatory Visit: Payer: Self-pay | Admitting: Internal Medicine

## 2015-05-30 ENCOUNTER — Encounter: Payer: Self-pay | Admitting: Internal Medicine

## 2015-05-30 ENCOUNTER — Encounter (HOSPITAL_COMMUNITY): Payer: Self-pay | Admitting: *Deleted

## 2015-05-30 ENCOUNTER — Emergency Department (HOSPITAL_COMMUNITY)
Admission: EM | Admit: 2015-05-30 | Discharge: 2015-05-30 | Disposition: A | Discharge status: Home / Self Care | Payer: Managed Care, Other (non HMO) | Attending: Emergency Medicine | Admitting: Emergency Medicine

## 2015-05-30 DIAGNOSIS — F329 Major depressive disorder, single episode, unspecified: Secondary | ICD-10-CM | POA: Insufficient documentation

## 2015-05-30 DIAGNOSIS — Z8669 Personal history of other diseases of the nervous system and sense organs: Secondary | ICD-10-CM | POA: Insufficient documentation

## 2015-05-30 DIAGNOSIS — Y9289 Other specified places as the place of occurrence of the external cause: Secondary | ICD-10-CM | POA: Insufficient documentation

## 2015-05-30 DIAGNOSIS — Z8719 Personal history of other diseases of the digestive system: Secondary | ICD-10-CM | POA: Insufficient documentation

## 2015-05-30 DIAGNOSIS — M5431 Sciatica, right side: Secondary | ICD-10-CM | POA: Insufficient documentation

## 2015-05-30 DIAGNOSIS — Z79899 Other long term (current) drug therapy: Secondary | ICD-10-CM | POA: Insufficient documentation

## 2015-05-30 DIAGNOSIS — S39012A Strain of muscle, fascia and tendon of lower back, initial encounter: Secondary | ICD-10-CM | POA: Diagnosis not present

## 2015-05-30 DIAGNOSIS — X500XXA Overexertion from strenuous movement or load, initial encounter: Secondary | ICD-10-CM | POA: Insufficient documentation

## 2015-05-30 DIAGNOSIS — F17210 Nicotine dependence, cigarettes, uncomplicated: Secondary | ICD-10-CM | POA: Diagnosis not present

## 2015-05-30 DIAGNOSIS — Z791 Long term (current) use of non-steroidal anti-inflammatories (NSAID): Secondary | ICD-10-CM | POA: Diagnosis not present

## 2015-05-30 DIAGNOSIS — Y990 Civilian activity done for income or pay: Secondary | ICD-10-CM | POA: Diagnosis not present

## 2015-05-30 DIAGNOSIS — M549 Dorsalgia, unspecified: Secondary | ICD-10-CM | POA: Diagnosis present

## 2015-05-30 DIAGNOSIS — Y9389 Activity, other specified: Secondary | ICD-10-CM | POA: Insufficient documentation

## 2015-05-30 HISTORY — DX: Carpal tunnel syndrome, bilateral upper limbs: G56.03

## 2015-05-30 MED ORDER — DIAZEPAM 5 MG/ML IJ SOLN
2.5000 mg | Freq: Once | INTRAMUSCULAR | Status: AC
  Administered 2015-05-30: 2.5 mg via INTRAVENOUS
  Filled 2015-05-30: qty 2

## 2015-05-30 MED ORDER — PREDNISONE 20 MG PO TABS
60.0000 mg | ORAL_TABLET | Freq: Once | ORAL | Status: AC
  Administered 2015-05-30: 60 mg via ORAL
  Filled 2015-05-30: qty 3

## 2015-05-30 MED ORDER — DIAZEPAM 5 MG PO TABS: 5.0000 mg | ORAL_TABLET | Freq: Two times a day (BID) | ORAL | Status: DC

## 2015-05-30 MED ORDER — KETOROLAC TROMETHAMINE 30 MG/ML IJ SOLN
30.0000 mg | Freq: Once | INTRAMUSCULAR | Status: AC
Start: 2015-05-30 — End: 2015-05-30
  Administered 2015-05-30: 30 mg via INTRAVENOUS
  Filled 2015-05-30: qty 1

## 2015-05-30 MED ORDER — TRAMADOL HCL 50 MG PO TABS: 50.0000 mg | ORAL_TABLET | Freq: Four times a day (QID) | ORAL | Status: DC | PRN

## 2015-05-30 MED ORDER — PREDNISONE 10 MG PO TABS: 20.0000 mg | ORAL_TABLET | Freq: Every day | ORAL | Status: DC

## 2015-05-30 NOTE — ED Provider Notes (Signed)
CSN: [id number]     Arrival date & time 05/30/15  0109 History   First MD Initiated Contact with Patient 05/30/15 0200     Chief Complaint  Patient presents with  . Back Pain     (Consider location/radiation/quality/duration/timing/severity/associated sxs/prior Treatment) HPI   Patient has a PMH of headaches, depression and diverticulitis comes to the ER with complaints of back pain. She was lifting water at work 2 days ago but the pain has progressively been getting worse. She now is having pain that radiates towards her thigh. She has not had any urine or bowel incontinence. She has no numbness or tingling. She is ambulatory.  PCP: Myrlene BrokerElizabeth A Crawford, MD Becky Beck is a 40 y.o.  female  ROS: The patient denies diaphoresis, fever, headache, weakness (general or focal), confusion, change of vision,  dysphagia, aphagia, shortness of breath,  abdominal pains, nausea, vomiting, diarrhea, lower extremity swelling, rash, neck pain, chest pain   Past Medical History  Diagnosis Date  . Headache(784.0)   . Depression   . Diverticulitis   . Carpal tunnel syndrome on both sides    History reviewed. No pertinent past surgical history. Family History  Problem Relation Age of Onset  . Cancer Mother     colon  . Heart disease Father   . Hyperlipidemia Father   . Hypertension Father   . Diabetes Father   . Asthma Father    Social History  Substance Use Topics  . Smoking status: Current Every Day Smoker -- 1.00 packs/day for 17 years    Types: Cigarettes  . Smokeless tobacco: Never Used  . Alcohol Use: No   OB History    No data available     Review of Systems  Review of Systems All other systems negative except as documented in the HPI. All pertinent positives and negatives as reviewed in the HPI.   Allergies  Review of patient's allergies indicates no known allergies.  Home Medications   Prior to Admission medications   Medication Sig Start Date End Date Taking?  Authorizing Provider  Aspirin-Acetaminophen-Caffeine (GOODY HEADACHE PO) Take 1 Package by mouth 4 (four) times daily as needed (pain). Four daily for headaches and foot pain and mouth pain    Historical Provider, MD  diazepam (VALIUM) 5 MG tablet Take 1 tablet (5 mg total) by mouth 2 (two) times daily. 05/30/15   Orlen Leedy Neva SeatGreene, PA-C  FLUoxetine (PROZAC) 20 MG tablet Take 2 tablets (40 mg total) by mouth daily. 04/04/15   Myrlene BrokerElizabeth A Crawford, MD  gabapentin (NEURONTIN) 300 MG capsule Take 1 capsule (300 mg total) by mouth 3 (three) times daily. 04/04/15   Myrlene BrokerElizabeth A Crawford, MD  meloxicam (MOBIC) 7.5 MG tablet TAKE 1 TABLET (7.5 MG TOTAL) BY MOUTH DAILY. 05/09/15   Myrlene BrokerElizabeth A Crawford, MD  predniSONE (DELTASONE) 10 MG tablet Take 2 tablets (20 mg total) by mouth daily. 05/30/15   Eknoor Novack Neva SeatGreene, PA-C  traMADol (ULTRAM) 50 MG tablet Take 1 tablet (50 mg total) by mouth every 6 (six) hours as needed. 05/30/15   Christianne Zacher Neva SeatGreene, PA-C   BP 132/80 mmHg  Pulse 76  Temp(Src) 97.8 F (36.6 C) (Oral)  Resp 17  SpO2 97%  LMP 05/16/2015   Physical Exam  Constitutional: She appears well-developed and well-nourished. No distress.  HENT:  Head: Normocephalic and atraumatic.  Eyes: Pupils are equal, round, and reactive to light.  Neck: Normal range of motion. Neck supple.  Cardiovascular: Normal rate and regular rhythm.   Pulmonary/Chest:  Effort normal.  Abdominal: Soft.  Musculoskeletal:  Symmetrical and physiologic strength to bilateral lower extremities.  Neurosensory function adequate to both legs Skin color is normal. Skin is warm and moist.  No step off deformity appreciated and no midline bony tenderness.  Ambulatory  No crepitus, laceration, effusion, induration, lesions Pedal pulses are symmetrical and palpable bilaterally   No tenderness to midline, positive tenderness to paraspinal muscles of the right lower lumbar region and lateral right thigh.  No clonus on dorsiflextion    Neurological: She is alert.  Skin: Skin is warm and dry.  Nursing note and vitals reviewed.   ED Course  Procedures (including critical care time) Labs Review Labs Reviewed - No data to display  Imaging Review No results found. I have personally reviewed and evaluated these images and lab results as part of my medical decision-making.   EKG Interpretation None      MDM   Final diagnoses:  Sciatica of right side    Medications  diazepam (VALIUM) injection 2.5 mg (2.5 mg Intravenous Given 05/30/15 0256)  ketorolac (TORADOL) 30 MG/ML injection 30 mg (30 mg Intravenous Given 05/30/15 0259)  predniSONE (DELTASONE) tablet 60 mg (60 mg Oral Given 05/30/15 0328)   39 y.o.Becky Beck's  with back pain.   No neurological deficits and normal neuro exam. No loss of bowel or bladder control. No concern for cauda equina at this time base on HPI and physical exam findings. No fever, night sweats, weight loss, h/o cancer, IVDU. The patient can walk with some discomfort.   Patient Plan 1. Medications: NSAIDs and/or muscle relaxer. Cont usual home medications unless otherwise directed. 2. Treatment: rest, drink plenty of fluids, gentle stretching as discussed, alternate ice and heat  3. Follow Up: Please followup with your primary doctor for discussion of your diagnoses and further evaluation after today's visit; if you do not have a primary care doctor use the resource guide provided to find one  Advised to follow-up with the orthopedist if symptoms do not start to resolve in the next 2-3 days. If develop loss of bowel or urinary control return to the ED as soon as possible for further evaluation. To take the medications as prescribed as they can cause harm if not taken appropriately.   Vital signs are stable at discharge. Filed Vitals:   05/30/15 0117 05/30/15 0331  BP: 125/71 132/80  Pulse: 66 76  Temp: 97.8 F (36.6 C) 97.8 F (36.6 C)  Resp: 18 17    Patient/guardian has voiced  understanding and agreed to follow-up with the PCP or specialist.         Marlon Pel, PA-C 05/30/15 0400  Devoria Albe, MD 05/30/15 [phone]

## 2015-05-30 NOTE — ED Notes (Signed)
Pt states that she was lifting water at work 2 days ago and felt a pull to her rt lower back / buttock area; pt states that she has had pain over the last 2 days but the pain has progressed and pt states that pain is now radiating to her rt thigh; pt denies numbness or tingling; pt ambulatory to triage

## 2015-05-30 NOTE — Discharge Instructions (Signed)
Sciatica °Sciatica is pain, weakness, numbness, or tingling along the path of the sciatic nerve. The nerve starts in the lower back and runs down the back of each leg. The nerve controls the muscles in the lower leg and in the back of the knee, while also providing sensation to the back of the thigh, lower leg, and the sole of your foot. Sciatica is a symptom of another medical condition. For instance, nerve damage or certain conditions, such as a herniated disk or bone spur on the spine, pinch or put pressure on the sciatic nerve. This causes the pain, weakness, or other sensations normally associated with sciatica. Generally, sciatica only affects one side of the body. °CAUSES  °· Herniated or slipped disc. °· Degenerative disk disease. °· A pain disorder involving the narrow muscle in the buttocks (piriformis syndrome). °· Pelvic injury or fracture. °· Pregnancy. °· Tumor (rare). °SYMPTOMS  °Symptoms can vary from mild to very severe. The symptoms usually travel from the low back to the buttocks and down the back of the leg. Symptoms can include: °· Mild tingling or dull aches in the lower back, leg, or hip. °· Numbness in the back of the calf or sole of the foot. °· Burning sensations in the lower back, leg, or hip. °· Sharp pains in the lower back, leg, or hip. °· Leg weakness. °· Severe back pain inhibiting movement. °These symptoms may get worse with coughing, sneezing, laughing, or prolonged sitting or standing. Also, being overweight may worsen symptoms. °DIAGNOSIS  °Your caregiver will perform a physical exam to look for common symptoms of sciatica. He or she may ask you to do certain movements or activities that would trigger sciatic nerve pain. Other tests may be performed to find the cause of the sciatica. These may include: °· Blood tests. °· X-rays. °· Imaging tests, such as an MRI or CT scan. °TREATMENT  °Treatment is directed at the cause of the sciatic pain. Sometimes, treatment is not necessary  and the pain and discomfort goes away on its own. If treatment is needed, your caregiver may suggest: °· Over-the-counter medicines to relieve pain. °· Prescription medicines, such as anti-inflammatory medicine, muscle relaxants, or narcotics. °· Applying heat or ice to the painful area. °· Steroid injections to lessen pain, irritation, and inflammation around the nerve. °· Reducing activity during periods of pain. °· Exercising and stretching to strengthen your abdomen and improve flexibility of your spine. Your caregiver may suggest losing weight if the extra weight makes the back pain worse. °· Physical therapy. °· Surgery to eliminate what is pressing or pinching the nerve, such as a bone spur or part of a herniated disk. °HOME CARE INSTRUCTIONS  °· Only take over-the-counter or prescription medicines for pain or discomfort as directed by your caregiver. °· Apply ice to the affected area for 20 minutes, 3-4 times a day for the first 48-72 hours. Then try heat in the same way. °· Exercise, stretch, or perform your usual activities if these do not aggravate your pain. °· Attend physical therapy sessions as directed by your caregiver. °· Keep all follow-up appointments as directed by your caregiver. °· Do not wear high heels or shoes that do not provide proper support. °· Check your mattress to see if it is too soft. A firm mattress may lessen your pain and discomfort. °SEEK IMMEDIATE MEDICAL CARE IF:  °· You lose control of your bowel or bladder (incontinence). °· You have increasing weakness in the lower back, pelvis, buttocks,   or legs.  You have redness or swelling of your back.  You have a burning sensation when you urinate.  You have pain that gets worse when you lie down or awakens you at night.  Your pain is worse than you have experienced in the past.  Your pain is lasting longer than 4 weeks.  You are suddenly losing weight without reason. MAKE SURE YOU:  Understand these  instructions.  Will watch your condition.  Will get help right away if you are not doing well or get worse.   This information is not intended to replace advice given to you by your health care provider. Make sure you discuss any questions you have with your health care provider.   Document Released: 01/14/2001 Document Revised: 10/11/2014 Document Reviewed: 06/01/2011 Elsevier Interactive Patient Education 2016 [employer] Lumbosacral Strain Lumbosacral strain is a strain of any of the parts that make up your lumbosacral vertebrae. Your lumbosacral vertebrae are the bones that make up the lower third of your backbone. Your lumbosacral vertebrae are held together by muscles and tough, fibrous tissue (ligaments).  CAUSES  A sudden blow to your back can cause lumbosacral strain. Also, anything that causes an excessive stretch of the muscles in the low back can cause this strain. This is typically seen when people exert themselves strenuously, fall, lift heavy objects, bend, or crouch repeatedly. RISK FACTORS  Physically demanding work.  Participation in pushing or pulling sports or sports that require a sudden twist of the back (tennis, golf, baseball).  Weight lifting.  Excessive lower back curvature.  Forward-tilted pelvis.  Weak back or abdominal muscles or both.  Tight hamstrings. SIGNS AND SYMPTOMS  Lumbosacral strain may cause pain in the area of your injury or pain that moves (radiates) down your leg.  DIAGNOSIS Your health care provider can often diagnose lumbosacral strain through a physical exam. In some cases, you may need tests such as X-ray exams.  TREATMENT  Treatment for your lower back injury depends on many factors that your clinician will have to evaluate. However, most treatment will include the use of anti-inflammatory medicines. HOME CARE INSTRUCTIONS   Avoid hard physical activities (tennis, racquetball, waterskiing) if you are not in proper physical  condition for it. This may aggravate or create problems.  If you have a back problem, avoid sports requiring sudden body movements. Swimming and walking are generally safer activities.  Maintain good posture.  Maintain a healthy weight.  For acute conditions, you may put ice on the injured area.  Put ice in a plastic bag.  Place a towel between your skin and the bag.  Leave the ice on for 20 minutes, 2-3 times a day.  When the low back starts healing, stretching and strengthening exercises may be recommended. SEEK MEDICAL CARE IF:  Your back pain is getting worse.  You experience severe back pain not relieved with medicines. SEEK IMMEDIATE MEDICAL CARE IF:   You have numbness, tingling, weakness, or problems with the use of your arms or legs.  There is a change in bowel or bladder control.  You have increasing pain in any area of the body, including your belly (abdomen).  You notice shortness of breath, dizziness, or feel faint.  You feel sick to your stomach (nauseous), are throwing up (vomiting), or become sweaty.  You notice discoloration of your toes or legs, or your feet get very cold. MAKE SURE YOU:   Understand these instructions.  Will watch your condition.  Will get  help right away if you are not doing well or get worse.   This information is not intended to replace advice given to you by your health care provider. Make sure you discuss any questions you have with your health care provider.   Document Released: 10/30/2004 Document Revised: 02/10/2014 Document Reviewed: 09/08/2012 Elsevier Interactive Patient Education [employer]2016 [employer] Inc[employer]

## 2015-06-04 ENCOUNTER — Ambulatory Visit: Payer: Managed Care, Other (non HMO) | Admitting: Internal Medicine

## 2015-06-18 ENCOUNTER — Telehealth: Payer: Self-pay | Admitting: Internal Medicine

## 2015-06-25 ENCOUNTER — Other Ambulatory Visit: Payer: Self-pay | Admitting: Geriatric Medicine

## 2015-06-25 ENCOUNTER — Encounter: Payer: Self-pay | Admitting: Internal Medicine

## 2015-06-25 MED ORDER — FLUOXETINE HCL 20 MG PO TABS: 40.0000 mg | ORAL_TABLET | Freq: Every day | ORAL | Status: DC

## 2015-07-05 ENCOUNTER — Ambulatory Visit (INDEPENDENT_AMBULATORY_CARE_PROVIDER_SITE_OTHER)
Admission: RE | Admit: 2015-07-05 | Discharge: 2015-07-05 | Disposition: A | Discharge status: Home / Self Care | Payer: Managed Care, Other (non HMO) | Source: Ambulatory Visit | Attending: Internal Medicine | Admitting: Internal Medicine

## 2015-07-05 ENCOUNTER — Encounter: Payer: Self-pay | Admitting: Internal Medicine

## 2015-07-05 ENCOUNTER — Ambulatory Visit (INDEPENDENT_AMBULATORY_CARE_PROVIDER_SITE_OTHER): Payer: Managed Care, Other (non HMO) | Admitting: Internal Medicine

## 2015-07-05 ENCOUNTER — Other Ambulatory Visit (INDEPENDENT_AMBULATORY_CARE_PROVIDER_SITE_OTHER): Payer: Managed Care, Other (non HMO)

## 2015-07-05 VITALS — BP 128/78 | HR 47 | Temp 98.8°F | Resp 16 | Ht 71.0 in | Wt 243.0 lb

## 2015-07-05 DIAGNOSIS — F322 Major depressive disorder, single episode, severe without psychotic features: Secondary | ICD-10-CM | POA: Diagnosis not present

## 2015-07-05 DIAGNOSIS — M5431 Sciatica, right side: Secondary | ICD-10-CM

## 2015-07-05 LAB — COMPREHENSIVE METABOLIC PANEL
ALBUMIN: 4.1 g/dL (ref 3.5–5.2)
ALT: 16 U/L (ref 0–35)
AST: 18 U/L (ref 0–37)
Alkaline Phosphatase: 50 U/L (ref 39–117)
BUN: 22 mg/dL (ref 6–23)
CHLORIDE: 105 meq/L (ref 96–112)
CO2: 25 meq/L (ref 19–32)
CREATININE: 0.81 mg/dL (ref 0.40–1.20)
Calcium: 8.5 mg/dL (ref 8.4–10.5)
GFR: 82.93 mL/min (ref >60.00)
Glucose, Bld: 95 mg/dL (ref 70–99)
POTASSIUM: 4.2 meq/L (ref 3.5–5.1)
SODIUM: 137 meq/L (ref 135–145)
Total Bilirubin: 0.3 mg/dL (ref 0.2–1.2)
Total Protein: 7.1 g/dL (ref 6.0–8.3)

## 2015-07-05 LAB — TSH: TSH: 2.53 u[IU]/mL (ref 0.35–4.50)

## 2015-07-05 LAB — T4, FREE: Free T4: 0.69 ng/dL (ref 0.60–1.60)

## 2015-07-05 LAB — VITAMIN B12: Vitamin B-12: 504 pg/mL (ref 211–911)

## 2015-07-05 LAB — HEMOGLOBIN A1C: Hgb A1c MFr Bld: 5.3 % (ref 4.6–6.5)

## 2015-07-05 NOTE — Patient Instructions (Signed)
Call us if you have any problems with getting back on the prozac.   We will check the labs and the x-ray today for the weakness and numbness of the right leg.   It is okay to start some stretching and exercise to build back the strength.

## 2015-07-05 NOTE — Assessment & Plan Note (Signed)
Started with strain of low back and that pain is resolved. The numbness is better but still present. Indicated for x-ray lumbar spine. Also checking for metabolic cause with TSH, free T4, B12, CMP, Hga1c. Treat as appropriate.

## 2015-07-05 NOTE — Assessment & Plan Note (Signed)
Off prozac for about 1 month. Doing okay but getting her meds soon. Mailing today from her pharmacy. No SI/HI. Talked to her about coping strategies until then. She knows to call or seek care for SI/HI.

## 2015-07-05 NOTE — Progress Notes (Signed)
   Subjective:    Patient ID: Noel JourneyMelodie Keitt, female    DOB: 04/10/1975, 40 y.o.   MRN: [id number]  HPI The patient is a 40 YO female coming in for follow up of her mood disorder however her insurance has given her some problems and she has been out of medications for some time. She is having more anxiety but her depression is doing okay. No SI/HI. She was also treated at an ER for sciatica since our last visit and is still having some problems with this. The low back pain is resolved but she is still having some tingling and weakness of the right leg. She has not had any injury to the area. No fevers or chills and no weight change.   Review of Systems  Constitutional: Negative for fever, activity change and appetite change.  Respiratory: Negative for cough, chest tightness and shortness of breath.   Cardiovascular: Negative for chest pain, palpitations and leg swelling.  Gastrointestinal: Negative for nausea, abdominal pain, diarrhea, constipation and abdominal distention.  Musculoskeletal: Positive for arthralgias. Negative for myalgias and back pain.  Skin: Negative.   Neurological: Positive for numbness. Negative for dizziness, light-headedness and headaches.  Psychiatric/Behavioral: Positive for dysphoric mood and decreased concentration. Negative for behavioral problems and sleep disturbance. The patient is nervous/anxious.       Objective:   Physical Exam  Constitutional: She is oriented to person, place, and time. She appears well-developed and well-nourished.  HENT:  Head: Normocephalic and atraumatic.  Eyes: EOM are normal.  Neck: Normal range of motion.  Cardiovascular: Normal rate and regular rhythm.   Pulmonary/Chest: Effort normal and breath sounds normal. No respiratory distress. She has no wheezes. She has no rales.  Abdominal: Soft. Bowel sounds are normal. She exhibits no distension. There is no tenderness. There is no rebound.  Musculoskeletal:  Some change in sensation  right leg and mild weakness in the thigh  Neurological: She is alert and oriented to person, place, and time.  Skin: Skin is warm and dry.  Psychiatric:  More anxious today   Filed Vitals:   07/05/15 0939  BP: 128/78  Pulse: 47  Temp: 98.8 F (37.1 C)  TempSrc: Oral  Resp: 16  Height: 5\' 11"  (1.803 m)  Weight: 243 lb (110.224 kg)  SpO2: 98%      Assessment & Plan:

## 2015-07-05 NOTE — Progress Notes (Signed)
Pre visit review using our clinic review tool, if applicable. No additional management support is needed unless otherwise documented below in the visit note. 

## 2016-03-18 ENCOUNTER — Ambulatory Visit (INDEPENDENT_AMBULATORY_CARE_PROVIDER_SITE_OTHER): Payer: Managed Care, Other (non HMO) | Admitting: Internal Medicine

## 2016-03-18 ENCOUNTER — Encounter: Payer: Self-pay | Admitting: Internal Medicine

## 2016-03-18 DIAGNOSIS — A084 Viral intestinal infection, unspecified: Secondary | ICD-10-CM | POA: Insufficient documentation

## 2016-03-18 MED ORDER — ONDANSETRON HCL 4 MG PO TABS: 4.0000 mg | ORAL_TABLET | Freq: Three times a day (TID) | ORAL | 0 refills | Status: DC | PRN

## 2016-03-18 MED ORDER — FLUOXETINE HCL 20 MG PO TABS: 40.0000 mg | ORAL_TABLET | Freq: Every day | ORAL | 3 refills | Status: DC

## 2016-03-18 NOTE — Progress Notes (Signed)
   Subjective:    Patient ID: Becky Beck, female    DOB: 09/26/1975, 41 y.o.   MRN: [id number]  HPI The patient is a 41 YO female coming in for loose stools, chills for a little bit. She is also feeling nauseous as well. Had a tooth pulled recently and they gave her antibiotics before this started (given amoxicillin starting 03/07/16 for 1 week). She had an episode of vomiting last Thursday after eating dinner. Then she felt well until yesterday when she had loose watery stools all day. She had vomiting that evening. She was able to eat yesterday but poor appetite. Today she denies diarrhea but is having some nausea. Has not eaten anything and only a coke today for liquid. No pain and not typical for her diverticulitis. No fevers.   Review of Systems  Constitutional: Positive for appetite change and chills. Negative for activity change, diaphoresis, fatigue, fever and unexpected weight change.  HENT: Negative.   Respiratory: Negative.   Cardiovascular: Negative.   Gastrointestinal: Positive for diarrhea, nausea and vomiting. Negative for abdominal distention, abdominal pain, blood in stool and constipation.  Musculoskeletal: Negative.   Neurological: Negative.       Objective:   Physical Exam  Constitutional: She is oriented to person, place, and time. She appears well-developed and well-nourished.  HENT:  Head: Normocephalic and atraumatic.  Eyes: EOM are normal.  Neck: Normal range of motion.  Cardiovascular: Normal rate and regular rhythm.   Pulmonary/Chest: Effort normal and breath sounds normal.  Abdominal: Soft. Bowel sounds are normal. She exhibits no distension and no mass. There is no tenderness. There is no rebound and no guarding.  Neurological: She is alert and oriented to person, place, and time.  Skin: Skin is warm and dry.   Vitals:   03/18/16 1445  BP: 128/80  Pulse: (!) 59  Temp: 98.1 F (36.7 C)  TempSrc: Oral  SpO2: 100%  Weight: 253 lb (114.8 kg)  Height: 5'  11" (1.803 m)      Assessment & Plan:

## 2016-03-18 NOTE — Progress Notes (Signed)
Pre visit review using our clinic review tool, if applicable. No additional management support is needed unless otherwise documented below in the visit note. 

## 2016-03-18 NOTE — Assessment & Plan Note (Signed)
Rx for zofran for the nausea. No more diarrhea today, given brat diet and encouraged to push more fluids. Given course not likely to be related to the amoxicillin but if more diarrhea will check for c dif.

## 2016-03-18 NOTE — Patient Instructions (Addendum)
We have sent in the zofran which is the nausea medicine. You can take this up to 3 times per day to help with nausea.    Food Choices to Help Relieve Diarrhea, Adult When you have diarrhea, the foods you eat and your eating habits are very important. Choosing the right foods and drinks can help relieve diarrhea. Also, because diarrhea can last up to 7 days, you need to replace lost fluids and electrolytes (such as sodium, potassium, and chloride) in order to help prevent dehydration. What general guidelines do I need to follow?  Slowly drink 1 cup (8 oz) of fluid for each episode of diarrhea. If you are getting enough fluid, your urine will be clear or pale yellow.  Eat starchy foods. Some good choices include white rice, white toast, pasta, low-fiber cereal, baked potatoes (without the skin), saltine crackers, and bagels.  Avoid large servings of any cooked vegetables.  Limit fruit to two servings per day. A serving is  cup or 1 small piece.  Choose foods with less than 2 g of fiber per serving.  Limit fats to less than 8 tsp (38 g) per day.  Avoid fried foods.  Eat foods that have probiotics in them. Probiotics can be found in certain dairy products.  Avoid foods and beverages that may increase the speed at which food moves through the stomach and intestines (gastrointestinal tract). Things to avoid include:  High-fiber foods, such as dried fruit, raw fruits and vegetables, nuts, seeds, and whole grain foods.  Spicy foods and high-fat foods.  Foods and beverages sweetened with high-fructose corn syrup, honey, or sugar alcohols such as xylitol, sorbitol, and mannitol. What foods are recommended? Grains  White rice. White, [place]French, or pita breads (fresh or toasted), including plain rolls, buns, or bagels. White pasta. Saltine, soda, or graham crackers. Pretzels. Low-fiber cereal. Cooked cereals made with water (such as cornmeal, farina, or cream cereals). Plain muffins. Matzo. Melba  toast. Zwieback. Vegetables  Potatoes (without the skin). Strained tomato and vegetable juices. Most well-cooked and canned vegetables without seeds. Tender lettuce. Fruits  Cooked or canned applesauce, apricots, cherries, fruit cocktail, grapefruit, peaches, pears, or plums. Fresh bananas, apples without skin, cherries, grapes, cantaloupe, grapefruit, peaches, oranges, or plums. Meat and Other Protein Products  Baked or boiled chicken. Eggs. Tofu. Fish. Seafood. Smooth peanut butter. Ground or well-cooked tender beef, ham, veal, lamb, pork, or poultry. Dairy  Plain yogurt, kefir, and unsweetened liquid yogurt. Lactose-free milk, buttermilk, or soy milk. Plain hard cheese. Beverages  Sport drinks. Clear broths. Diluted fruit juices (except prune). Regular, caffeine-free sodas such as ginger ale. Water. Decaffeinated teas. Oral rehydration solutions. Sugar-free beverages not sweetened with sugar alcohols. Other  Bouillon, broth, or soups made from recommended foods. The items listed above may not be a complete list of recommended foods or beverages. Contact your dietitian for more options.  What foods are not recommended? Grains  Whole grain, whole wheat, bran, or rye breads, rolls, pastas, crackers, and cereals. Wild or brown rice. Cereals that contain more than 2 g of fiber per serving. Corn tortillas or taco shells. Cooked or dry oatmeal. Granola. Popcorn. Vegetables  Raw vegetables. Cabbage, broccoli, Brussels sprouts, artichokes, baked beans, beet greens, corn, kale, legumes, peas, sweet potatoes, and yams. Potato skins. Cooked spinach and cabbage. Fruits  Dried fruit, including raisins and dates. Raw fruits. Stewed or dried prunes. Fresh apples with skin, apricots, mangoes, pears, raspberries, and strawberries. Meat and Other Protein Products  Chunky peanut butter. Nuts  and seeds. Beans and lentils. Berniece Salines. Dairy  High-fat cheeses. Milk, chocolate milk, and beverages made with milk, such  as milk shakes. Cream. Ice cream. Sweets and Desserts  Sweet rolls, doughnuts, and sweet breads. Pancakes and waffles. Fats and Oils  Butter. Cream sauces. Margarine. Salad oils. Plain salad dressings. Olives. Avocados. Beverages  Caffeinated beverages (such as coffee, tea, soda, or energy drinks). Alcoholic beverages. Fruit juices with pulp. Prune juice. Soft drinks sweetened with high-fructose corn syrup or sugar alcohols. Other  Coconut. Hot sauce. Chili powder. Mayonnaise. Gravy. Cream-based or milk-based soups. The items listed above may not be a complete list of foods and beverages to avoid. Contact your dietitian for more information.  What should I do if I become dehydrated? Diarrhea can sometimes lead to dehydration. Signs of dehydration include dark urine and dry mouth and skin. If you think you are dehydrated, you should rehydrate with an oral rehydration solution. These solutions can be purchased at pharmacies, retail stores, or online. Drink -1 cup (120-240 mL) of oral rehydration solution each time you have an episode of diarrhea. If drinking this amount makes your diarrhea worse, try drinking smaller amounts more often. For example, drink 1-3 tsp (5-15 mL) every 5-10 minutes. A general rule for staying hydrated is to drink 1-2 L of fluid per day. Talk to your health care provider about the specific amount you should be drinking each day. Drink enough fluids to keep your urine clear or pale yellow. This information is not intended to replace advice given to you by your health care provider. Make sure you discuss any questions you have with your health care provider. Document Released: 04/12/2003 Document Revised: 06/28/2015 Document Reviewed: 12/13/2012 Elsevier Interactive Patient Education  2017 [employer].

## 2016-08-21 ENCOUNTER — Emergency Department (HOSPITAL_COMMUNITY)
Admission: EM | Admit: 2016-08-21 | Discharge: 2016-08-22 | Discharge status: Against Medical Advice | Payer: Managed Care, Other (non HMO)

## 2016-08-21 NOTE — ED Notes (Signed)
Called pt x 2 to room no answer

## 2016-08-21 NOTE — ED Notes (Signed)
Pt called for triage x2, no response from lobby 

## 2017-03-15 ENCOUNTER — Other Ambulatory Visit: Payer: Self-pay | Admitting: Internal Medicine

## 2017-11-16 IMAGING — DX DG LUMBAR SPINE COMPLETE 4+V
5 series · 5 of 5 positions shown · non-contrast
Comparison: Attempted abdomen 09/02/2011.

CLINICAL DATA: Patient with anterior thigh pain and numbness for 1
month.

EXAM:
LUMBAR SPINE - COMPLETE 4+ VIEW

[l-spine ap]
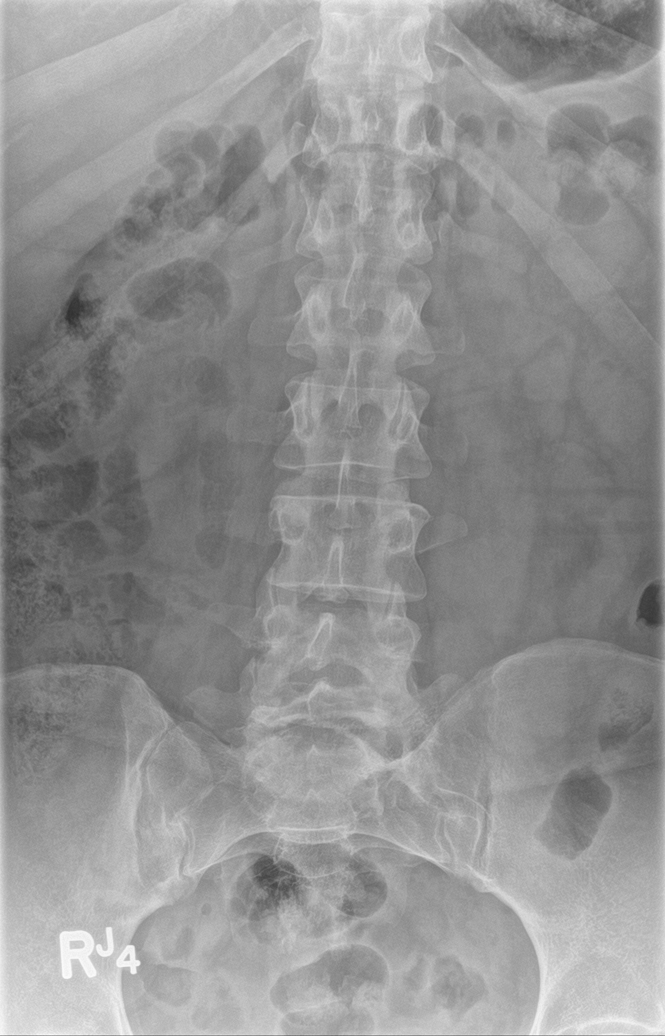

[l-spine obl (1 of 2)]
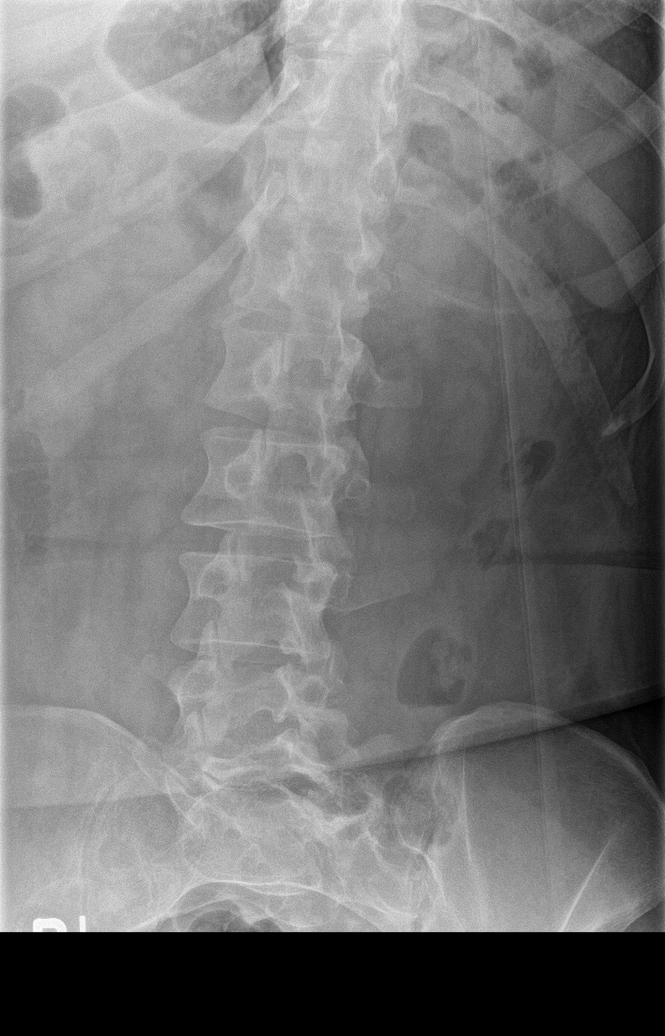

[l-spine obl (2 of 2)]
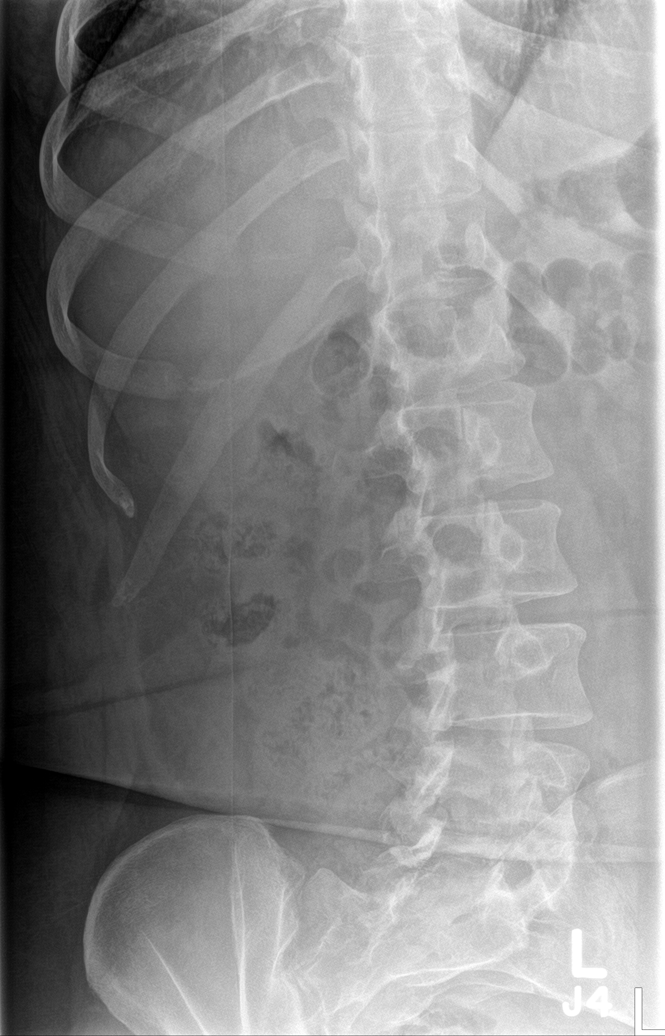

[l-spine lat]
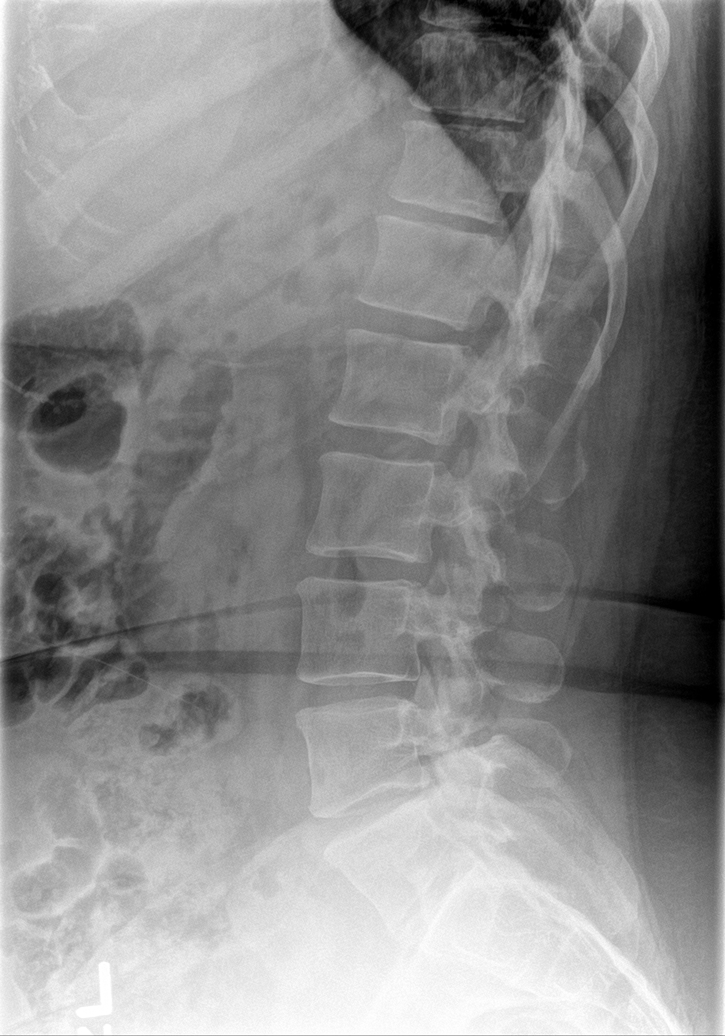

[l-spine spot]
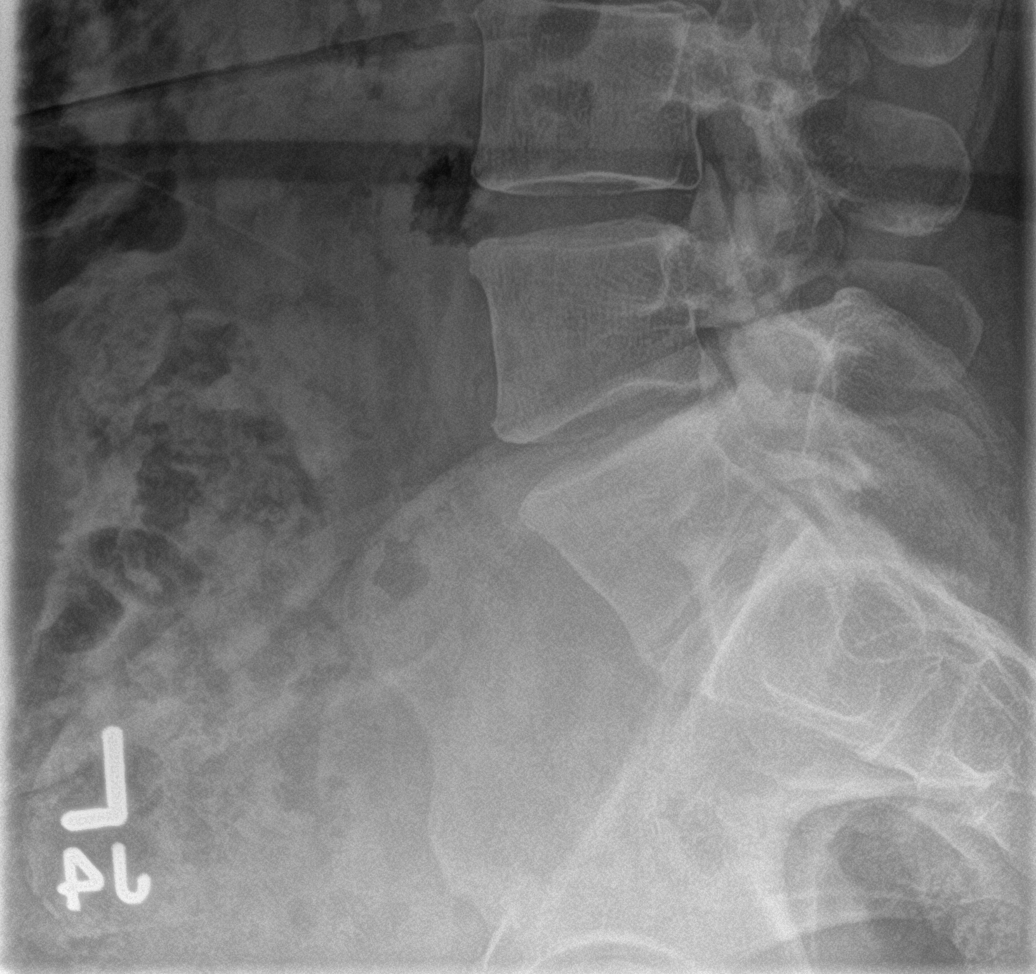

[5 of 5 positions shown; findings below may reference images not displayed]

FINDINGS: Normal anatomic alignment. Preservation of the vertebral body and
intervertebral disc space heights. Lower lumbar spine facet
degenerative changes. SI joints are unremarkable.
IMPRESSION: No acute osseous abnormality. Lumbar spine facet degenerative
changes.

## 2021-01-17 ENCOUNTER — Encounter: Payer: Self-pay | Admitting: Nurse Practitioner

## 2021-01-17 ENCOUNTER — Ambulatory Visit: Payer: Managed Care, Other (non HMO) | Admitting: Nurse Practitioner

## 2021-01-17 ENCOUNTER — Other Ambulatory Visit: Payer: Self-pay

## 2021-01-17 VITALS — BP 147/80 | HR 77 | Temp 98.1°F | Ht 71.0 in | Wt 252.6 lb

## 2021-01-17 DIAGNOSIS — N390 Urinary tract infection, site not specified: Secondary | ICD-10-CM

## 2021-01-17 DIAGNOSIS — F322 Major depressive disorder, single episode, severe without psychotic features: Secondary | ICD-10-CM

## 2021-01-17 DIAGNOSIS — Z7689 Persons encountering health services in other specified circumstances: Secondary | ICD-10-CM

## 2021-01-17 DIAGNOSIS — Z6835 Body mass index (BMI) 35.0-35.9, adult: Secondary | ICD-10-CM | POA: Diagnosis not present

## 2021-01-17 DIAGNOSIS — M19042 Primary osteoarthritis, left hand: Secondary | ICD-10-CM

## 2021-01-17 DIAGNOSIS — M19041 Primary osteoarthritis, right hand: Secondary | ICD-10-CM | POA: Diagnosis not present

## 2021-01-17 LAB — POCT URINALYSIS DIP (CLINITEK)
Bilirubin, UA: NEGATIVE
Glucose, UA: NEGATIVE mg/dL
Ketones, POC UA: NEGATIVE mg/dL
Nitrite, UA: NEGATIVE
POC PROTEIN,UA: NEGATIVE
Spec Grav, UA: 1.025 (ref 1.010–1.025)
Urobilinogen, UA: NEGATIVE E.U./dL — AB
pH, UA: 5.5 (ref 5.0–8.0)

## 2021-01-17 MED ORDER — MELOXICAM 7.5 MG PO TABS: 7.5000 mg | ORAL_TABLET | Freq: Every day | ORAL | 1 refills | Status: DC

## 2021-01-17 MED ORDER — FLUOXETINE HCL 10 MG PO TABS: 10.0000 mg | ORAL_TABLET | Freq: Every day | ORAL | 1 refills | Status: DC

## 2021-01-17 MED ORDER — NITROFURANTOIN MONOHYD MACRO 100 MG PO CAPS: 100.0000 mg | ORAL_CAPSULE | Freq: Two times a day (BID) | ORAL | 0 refills | Status: DC

## 2021-01-17 NOTE — Progress Notes (Signed)
Patient started on macrobid at time of visit. Urine sample sent for culture and sensitivity. Will adjust antibiotics as indicated .

## 2021-01-17 NOTE — Progress Notes (Signed)
New Patient Office Visit  Subjective:  Patient ID: Becky Beck, female    DOB: 16-Aug-1975  Age: 45 y.o. MRN: [id number]  CC:  Chief Complaint  Patient presents with   New Patient (Initial Visit)    HPI Becky Beck presents to establish new primary care provider. She states that she started having lower abdominal pain and vaginal irritation. This started yesterday.  The patient is complaining or bilateral carpal tunnel and tendonitis. She also has arthritis in the hands, more severe on the right side than the left. States that fingers will get stuck in bent position and is hard to open the hand.  She does have chronic anxiety. Was put on fluoxetine at one point. She states that she believes this helped, just has been off of this for several years.  She states she does have some low back pain, flank pain, and lower pelvic pain.  Has noticed increased urinary frequency and urgency.  The symptoms have been going on for a few days.  They are unchanged.   Past Medical History:  Diagnosis Date   Carpal tunnel syndrome on both sides    Depression    Diverticulitis    Headache(784.0)     History reviewed. No pertinent surgical history.  Family History  Problem Relation Age of Onset   Cancer Mother        colon   Heart disease Father    Hyperlipidemia Father    Hypertension Father    Diabetes Father    Asthma Father     Social History   Socioeconomic History   Marital status: Single    Spouse name: Not on file   Number of children: Not on file   Years of education: Not on file   Highest education level: Not on file  Occupational History   Not on file  Tobacco Use   Smoking status: Every Day    Packs/day: 1.00    Years: 17.00    Pack years: 17.00    Types: Cigarettes   Smokeless tobacco: Never  Substance and Sexual Activity   Alcohol use: No   Drug use: No    Types: Marijuana   Sexual activity: Yes  Other Topics Concern   Not on file  Social History Narrative    Not on file   Social Determinants of Health   Financial Resource Strain: Not on file  Food Insecurity: Not on file  Transportation Needs: Not on file  Physical Activity: Not on file  Stress: Not on file  Social Connections: Not on file  Intimate Partner Violence: Not on file    ROS Review of Systems  Constitutional:  Negative for activity change, appetite change, chills, fatigue and fever.  HENT:  Negative for congestion, postnasal drip, rhinorrhea, sinus pressure, sinus pain, sneezing and sore throat.   Eyes: Negative.   Respiratory:  Negative for cough, chest tightness, shortness of breath and wheezing.   Cardiovascular:  Negative for chest pain and palpitations.  Gastrointestinal:  Negative for abdominal pain, constipation, diarrhea, nausea and vomiting.  Endocrine: Negative for cold intolerance, heat intolerance, polydipsia and polyuria.  Genitourinary:  Positive for dysuria, flank pain, frequency and urgency. Negative for dyspareunia.  Musculoskeletal:  Positive for arthralgias and back pain. Negative for myalgias.       Pain in both wrists and hands.  Pain mostly along the medial aspect of both wrist instructions for first 3 fingers of both hands.  Skin:  Negative for rash.  Allergic/Immunologic: Negative for  environmental allergies.  Neurological:  Negative for dizziness, weakness and headaches.  Hematological:  Negative for adenopathy.  Psychiatric/Behavioral:  Positive for dysphoric mood. The patient is nervous/anxious.    Objective:   Today's Vitals   01/17/21 0937  BP: (!) 147/80  Pulse: 77  Temp: 98.1 F (36.7 C)  SpO2: 96%  Weight: 252 lb 9.6 oz (114.6 kg)  Height: 5\' 11"  (1.803 m)   Body mass index is 35.23 kg/m.   Physical Exam Vitals and nursing note reviewed.  Constitutional:      Appearance: Normal appearance. She is well-developed. She is obese.  HENT:     Head: Normocephalic and atraumatic.     Nose: Nose normal.     Mouth/Throat:     Mouth:  Mucous membranes are moist.     Pharynx: Oropharynx is clear.  Eyes:     Conjunctiva/sclera: Conjunctivae normal.     Pupils: Pupils are equal, round, and reactive to light.  Cardiovascular:     Rate and Rhythm: Normal rate and regular rhythm.     Pulses: Normal pulses.     Heart sounds: Normal heart sounds.  Pulmonary:     Effort: Pulmonary effort is normal.     Breath sounds: Normal breath sounds.  Abdominal:     General: Bowel sounds are normal. There is no distension.     Palpations: Abdomen is soft. There is no mass.     Tenderness: There is no abdominal tenderness. There is no guarding or rebound.     Hernia: No hernia is present.  Genitourinary:    Comments: There is moderate blood in urine sample today. Musculoskeletal:        General: Normal range of motion.     Cervical back: Normal range of motion and neck supple.     Comments: Tenderness with palpation of bilateral medial wrist.  Pain increases with pronation and supination of the hands.  Grip strength is slightly weak bilaterally.  No bony abnormalities or deformities are noted at this time.  Lymphadenopathy:     Cervical: No cervical adenopathy.  Skin:    General: Skin is warm and dry.     Capillary Refill: Capillary refill takes less than 2 seconds.  Neurological:     General: No focal deficit present.     Mental Status: She is alert and oriented to person, place, and time.  Psychiatric:        Attention and Perception: Attention and perception normal.        Mood and Affect: Affect normal. Mood is anxious.        Speech: Speech normal.        Behavior: Behavior normal. Behavior is cooperative.        Thought Content: Thought content normal.        Cognition and Memory: Cognition and memory normal.        Judgment: Judgment normal.    Assessment & Plan:  1. Encounter to establish care Appointment today to establish new primary care provider.  We will get medical records from previous primary care provider to  review and update patient chart.  2. Primary osteoarthritis of both hands Trial meloxicam 7.5 mg tablets daily as needed for pain and inflammation.  Recommended OTC response for carpal tunnel.  Advise she wear these at night while sleeping, and take off during the day. - meloxicam (MOBIC) 7.5 MG tablet; Take 1 tablet (7.5 mg total) by mouth daily.  Dispense: 30 tablet; Refill: 1  3.  Urinary tract infection without hematuria, site unspecified Treat with Macrobid 100 mg capsules twice daily for next 5 days.  We will send urine for culture and sensitivity and adjust antibiotic treatment as indicated. - nitrofurantoin, macrocrystal-monohydrate, (MACROBID) 100 MG capsule; Take 1 capsule (100 mg total) by mouth 2 (two) times daily.  Dispense: 10 capsule; Refill: 0 - POCT URINALYSIS DIP (CLINITEK) - Urine Culture  4. Current severe episode of major depressive disorder without psychotic features without prior episode (HCC) Restart fluoxetine 10 mg tablets daily.  Reviewed coping mechanisms to help deal with stress and anxiety.  We will reassess at next visit in approximately 6 weeks. - FLUoxetine (PROZAC) 10 MG tablet; Take 1 tablet (10 mg total) by mouth daily. Need annual visit for further refills  Dispense: 30 tablet; Refill: 1  5. Body mass index (BMI) of 35.0-35.9 in adult Discussed lowering calorie intake to 1500 calories per day and incorporating exercise into daily routine to help lose weight.   Problem List Items Addressed This Visit       Musculoskeletal and Integument   Primary osteoarthritis of both hands   Relevant Medications   meloxicam (MOBIC) 7.5 MG tablet     Genitourinary   Urinary tract infection without hematuria   Relevant Medications   nitrofurantoin, macrocrystal-monohydrate, (MACROBID) 100 MG capsule   Other Relevant Orders   POCT URINALYSIS DIP (CLINITEK) (Completed)   Urine Culture (Completed)     Other   Major depression   Relevant Medications   FLUoxetine  (PROZAC) 10 MG tablet   Body mass index (BMI) of 35.0-35.9 in adult   Other Visit Diagnoses     Encounter to establish care    -  Primary       Outpatient Encounter Medications as of 01/17/2021  Medication Sig   nitrofurantoin, macrocrystal-monohydrate, (MACROBID) 100 MG capsule Take 1 capsule (100 mg total) by mouth 2 (two) times daily.   Aspirin-Acetaminophen-Caffeine (GOODY HEADACHE PO) Take 1 Package by mouth 4 (four) times daily as needed (pain). Four daily for headaches and foot pain and mouth pain   FLUoxetine (PROZAC) 10 MG tablet Take 1 tablet (10 mg total) by mouth daily. Need annual visit for further refills   gabapentin (NEURONTIN) 300 MG capsule Take 1 capsule (300 mg total) by mouth 3 (three) times daily. (Patient not taking: Reported on 03/18/2016)   meloxicam (MOBIC) 7.5 MG tablet Take 1 tablet (7.5 mg total) by mouth daily.   ondansetron (ZOFRAN) 4 MG tablet Take 1 tablet (4 mg total) by mouth every 8 (eight) hours as needed for nausea or vomiting.   [DISCONTINUED] FLUoxetine (PROZAC) 20 MG tablet Take 2 tablets (40 mg total) by mouth daily. Need annual visit for further refills   [DISCONTINUED] meloxicam (MOBIC) 7.5 MG tablet TAKE 1 TABLET (7.5 MG TOTAL) BY MOUTH DAILY. (Patient not taking: No sig reported)   No facility-administered encounter medications on file as of 01/17/2021.    Follow-up: Return in about 5 weeks (around 02/21/2021) for health maintenance exam, with pap, FBW a week prior to visit.   Ronnell Freshwater, NP  This note was dictated using Systems analyst. Rapid proofreading was performed to expedite the delivery of the information. Despite proofreading, phonetic errors will occur which are common with this voice recognition software. Please take this into consideration. If there are any concerns, please contact our office.

## 2021-01-17 NOTE — Patient Instructions (Addendum)
Fat and Cholesterol Restricted Eating Plan Getting too much fat and cholesterol in your diet may cause health problems. Choosing the right foods helps keep your fat and cholesterol at normal levels. This can keep you from getting certain diseases. Your doctor may recommend an eating plan that includes: Total fat: ______% or less of total calories a day. This is ______g of fat a day. Saturated fat: ______% or less of total calories a day. This is ______g of saturated fat a day. Cholesterol: less than _________mg a day. Fiber: ______g a day. What are tips for following this plan? General tips Work with your doctor to lose weight if you need to. Avoid: Foods with added sugar. Fried foods. Foods with trans fat or partially hydrogenated oils. This includes some margarines and baked goods. If you drink alcohol: Limit how much you have to: 0-1 drink a day for women who are not pregnant. 0-2 drinks a day for men. Know how much alcohol is in a drink. In the [place] one drink equals one 12 oz bottle of beer (355 mL), one 5 oz glass of wine (148 mL), or one 1 oz glass of hard liquor (44 mL). Reading food labels Check food labels for: Trans fats. Partially hydrogenated oils. Saturated fat (g) in each serving. Cholesterol (mg) in each serving. Fiber (g) in each serving. Choose foods with healthy fats, such as: Monounsaturated fats and polyunsaturated fats. These include olive and canola oil, flaxseeds, walnuts, almonds, and seeds. Omega-3 fats. These are found in certain fish, flaxseed oil, and ground flaxseeds. Choose grain products that have whole grains. Look for the word "whole" as the first word in the ingredient list. Cooking Cook foods using low-fat methods. These include baking, boiling, grilling, and broiling. Eat more home-cooked foods. Eat at restaurants and buffets less often. Eat less fast food. Avoid cooking using saturated fats, such as butter, cream, palm oil, palm kernel oil, and  coconut oil. Meal planning  At meals, divide your plate into four equal parts: Fill one-half of your plate with vegetables, green salads, and fruit. Fill one-fourth of your plate with whole grains. Fill one-fourth of your plate with low-fat (lean) protein foods. Eat fish that is high in omega-3 fats at least two times a week. This includes mackerel, tuna, sardines, and salmon. Eat foods that are high in fiber, such as whole grains, beans, apples, pears, berries, broccoli, carrots, peas, and barley. What foods should I eat? Fruits All fresh, canned (in natural juice), or frozen fruits. Vegetables Fresh or frozen vegetables (raw, steamed, roasted, or grilled). Green salads. Grains Whole grains, such as whole wheat or whole grain breads, crackers, cereals, and pasta. Unsweetened oatmeal, bulgur, barley, quinoa, or brown rice. Corn or whole wheat flour tortillas. Meats and other protein foods Ground beef (85% or leaner), grass-fed beef, or beef trimmed of fat. Skinless chicken or turkey. Ground chicken or turkey. Pork trimmed of fat. All fish and seafood. Egg whites. Dried beans, peas, or lentils. Unsalted nuts or seeds. Unsalted canned beans. Nut butters without added sugar or oil. Dairy Low-fat or nonfat dairy products, such as skim or 1% milk, 2% or reduced-fat cheeses, low-fat and fat-free ricotta or cottage cheese, or plain low-fat and nonfat yogurt. Fats and oils Tub margarine without trans fats. Light or reduced-fat mayonnaise and salad dressings. Avocado. Olive, canola, sesame, or safflower oils. The items listed above may not be a complete list of foods and beverages you can eat. Contact a dietitian for more information. What foods   should I avoid? Fruits Canned fruit in heavy syrup. Fruit in cream or butter sauce. Fried fruit. Vegetables Vegetables cooked in cheese, cream, or butter sauce. Fried vegetables. Grains White bread. White pasta. White rice. Cornbread. Bagels, pastries,  and croissants. Crackers and snack foods that contain trans fat and hydrogenated oils. Meats and other protein foods Fatty cuts of meat. Ribs, chicken wings, bacon, sausage, bologna, salami, chitterlings, fatback, hot dogs, bratwurst, and packaged lunch meats. Liver and organ meats. Whole eggs and egg yolks. Chicken and turkey with skin. Fried meat. Dairy Whole or 2% milk, cream, half-and-half, and cream cheese. Whole milk cheeses. Whole-fat or sweetened yogurt. Full-fat cheeses. Nondairy creamers and whipped toppings. Processed cheese, cheese spreads, and cheese curds. Fats and oils Butter, stick margarine, lard, shortening, ghee, or bacon fat. Coconut, palm kernel, and palm oils. Beverages Alcohol. Sugar-sweetened drinks such as sodas, lemonade, and fruit drinks. Sweets and desserts Corn syrup, sugars, honey, and molasses. Candy. Jam and jelly. Syrup. Sweetened cereals. Cookies, pies, cakes, donuts, muffins, and ice cream. The items listed above may not be a complete list of foods and beverages you should avoid. Contact a dietitian for more information. Summary Choosing the right foods helps keep your fat and cholesterol at normal levels. This can keep you from getting certain diseases. At meals, fill one-half of your plate with vegetables, green salads, and fruits. Eat high fiber foods, like whole grains, beans, apples, pears, berries, carrots, peas, and barley. Limit added sugar, saturated fats, alcohol, and fried foods. This information is not intended to replace advice given to you by your health care provider. Make sure you discuss any questions you have with your health care provider. Document Revised: 06/01/2020 Document Reviewed: 06/01/2020 Elsevier Patient Education  2022 [employer]  Fat and Cholesterol Restricted Eating Plan Getting too much fat and cholesterol in your diet may cause health problems. Choosing the right foods helps keep your fat and cholesterol at normal levels.  This can keep you from getting certain diseases. Your doctor may recommend an eating plan that includes: Total fat: ______% or less of total calories a day. This is ______g of fat a day. Saturated fat: ______% or less of total calories a day. This is ______g of saturated fat a day. Cholesterol: less than _________mg a day. Fiber: ______g a day. What are tips for following this plan? General tips Work with your doctor to lose weight if you need to. Avoid: Foods with added sugar. Fried foods. Foods with trans fat or partially hydrogenated oils. This includes some margarines and baked goods. If you drink alcohol: Limit how much you have to: 0-1 drink a day for women who are not pregnant. 0-2 drinks a day for men. Know how much alcohol is in a drink. In the [place] one drink equals one 12 oz bottle of beer (355 mL), one 5 oz glass of wine (148 mL), or one 1 oz glass of hard liquor (44 mL). Reading food labels Check food labels for: Trans fats. Partially hydrogenated oils. Saturated fat (g) in each serving. Cholesterol (mg) in each serving. Fiber (g) in each serving. Choose foods with healthy fats, such as: Monounsaturated fats and polyunsaturated fats. These include olive and canola oil, flaxseeds, walnuts, almonds, and seeds. Omega-3 fats. These are found in certain fish, flaxseed oil, and ground flaxseeds. Choose grain products that have whole grains. Look for the word "whole" as the first word in the ingredient list. Cooking Cook foods using low-fat methods. These include baking, boiling, grilling,   and broiling. Eat more home-cooked foods. Eat at restaurants and buffets less often. Eat less fast food. Avoid cooking using saturated fats, such as butter, cream, palm oil, palm kernel oil, and coconut oil. Meal planning  At meals, divide your plate into four equal parts: Fill one-half of your plate with vegetables, green salads, and fruit. Fill one-fourth of your plate with whole  grains. Fill one-fourth of your plate with low-fat (lean) protein foods. Eat fish that is high in omega-3 fats at least two times a week. This includes mackerel, tuna, sardines, and salmon. Eat foods that are high in fiber, such as whole grains, beans, apples, pears, berries, broccoli, carrots, peas, and barley. What foods should I eat? Fruits All fresh, canned (in natural juice), or frozen fruits. Vegetables Fresh or frozen vegetables (raw, steamed, roasted, or grilled). Green salads. Grains Whole grains, such as whole wheat or whole grain breads, crackers, cereals, and pasta. Unsweetened oatmeal, bulgur, barley, quinoa, or brown rice. Corn or whole wheat flour tortillas. Meats and other protein foods Ground beef (85% or leaner), grass-fed beef, or beef trimmed of fat. Skinless chicken or turkey. Ground chicken or turkey. Pork trimmed of fat. All fish and seafood. Egg whites. Dried beans, peas, or lentils. Unsalted nuts or seeds. Unsalted canned beans. Nut butters without added sugar or oil. Dairy Low-fat or nonfat dairy products, such as skim or 1% milk, 2% or reduced-fat cheeses, low-fat and fat-free ricotta or cottage cheese, or plain low-fat and nonfat yogurt. Fats and oils Tub margarine without trans fats. Light or reduced-fat mayonnaise and salad dressings. Avocado. Olive, canola, sesame, or safflower oils. The items listed above may not be a complete list of foods and beverages you can eat. Contact a dietitian for more information. What foods should I avoid? Fruits Canned fruit in heavy syrup. Fruit in cream or butter sauce. Fried fruit. Vegetables Vegetables cooked in cheese, cream, or butter sauce. Fried vegetables. Grains White bread. White pasta. White rice. Cornbread. Bagels, pastries, and croissants. Crackers and snack foods that contain trans fat and hydrogenated oils. Meats and other protein foods Fatty cuts of meat. Ribs, chicken wings, bacon, sausage, bologna, salami,  chitterlings, fatback, hot dogs, bratwurst, and packaged lunch meats. Liver and organ meats. Whole eggs and egg yolks. Chicken and turkey with skin. Fried meat. Dairy Whole or 2% milk, cream, half-and-half, and cream cheese. Whole milk cheeses. Whole-fat or sweetened yogurt. Full-fat cheeses. Nondairy creamers and whipped toppings. Processed cheese, cheese spreads, and cheese curds. Fats and oils Butter, stick margarine, lard, shortening, ghee, or bacon fat. Coconut, palm kernel, and palm oils. Beverages Alcohol. Sugar-sweetened drinks such as sodas, lemonade, and fruit drinks. Sweets and desserts Corn syrup, sugars, honey, and molasses. Candy. Jam and jelly. Syrup. Sweetened cereals. Cookies, pies, cakes, donuts, muffins, and ice cream. The items listed above may not be a complete list of foods and beverages you should avoid. Contact a dietitian for more information. Summary Choosing the right foods helps keep your fat and cholesterol at normal levels. This can keep you from getting certain diseases. At meals, fill one-half of your plate with vegetables, green salads, and fruits. Eat high fiber foods, like whole grains, beans, apples, pears, berries, carrots, peas, and barley. Limit added sugar, saturated fats, alcohol, and fried foods. This information is not intended to replace advice given to you by your health care provider. Make sure you discuss any questions you have with your health care provider. Document Revised: 06/01/2020 Document Reviewed: 06/01/2020 Elsevier Patient Education    2022 [employer] Inc.  

## 2021-01-22 LAB — URINE CULTURE

## 2021-01-28 DIAGNOSIS — N390 Urinary tract infection, site not specified: Secondary | ICD-10-CM | POA: Insufficient documentation

## 2021-01-28 DIAGNOSIS — Z6835 Body mass index (BMI) 35.0-35.9, adult: Secondary | ICD-10-CM | POA: Insufficient documentation

## 2021-01-28 DIAGNOSIS — M19041 Primary osteoarthritis, right hand: Secondary | ICD-10-CM | POA: Insufficient documentation

## 2021-02-13 ENCOUNTER — Other Ambulatory Visit: Payer: Self-pay

## 2021-02-13 DIAGNOSIS — Z0000 Encounter for general adult medical examination without abnormal findings: Secondary | ICD-10-CM

## 2021-02-14 ENCOUNTER — Other Ambulatory Visit: Payer: Self-pay

## 2021-02-14 ENCOUNTER — Other Ambulatory Visit: Payer: Managed Care, Other (non HMO)

## 2021-02-14 DIAGNOSIS — Z0000 Encounter for general adult medical examination without abnormal findings: Secondary | ICD-10-CM

## 2021-02-15 LAB — COMPREHENSIVE METABOLIC PANEL
ALT: 20 IU/L (ref 0–32)
AST: 29 IU/L (ref 0–40)
Albumin/Globulin Ratio: 1.5 (ref 1.2–2.2)
Albumin: 4.3 g/dL (ref 3.8–4.8)
Alkaline Phosphatase: 79 IU/L (ref 44–121)
BUN/Creatinine Ratio: 24 — ABNORMAL HIGH (ref 9–23)
BUN: 22 mg/dL (ref 6–24)
Bilirubin Total: <0.2 mg/dL (ref 0.0–1.2)
CO2: 21 mmol/L (ref 20–29)
Calcium: 8.6 mg/dL — ABNORMAL LOW (ref 8.7–10.2)
Chloride: 101 mmol/L (ref 96–106)
Creatinine, Ser: 0.91 mg/dL (ref 0.57–1.00)
Globulin, Total: 2.9 g/dL (ref 1.5–4.5)
Glucose: 92 mg/dL (ref 70–99)
Potassium: 4.7 mmol/L (ref 3.5–5.2)
Sodium: 139 mmol/L (ref 134–144)
Total Protein: 7.2 g/dL (ref 6.0–8.5)
eGFR: 79 mL/min/{1.73_m2} (ref >59)

## 2021-02-15 LAB — CBC WITH DIFFERENTIAL/PLATELET
Basophils Absolute: 0 10*3/uL (ref 0.0–0.2)
Basos: 1 %
EOS (ABSOLUTE): 0.1 10*3/uL (ref 0.0–0.4)
Eos: 2 %
Hematocrit: 42.7 % (ref 34.0–46.6)
Hemoglobin: 14.9 g/dL (ref 11.1–15.9)
Immature Grans (Abs): 0 10*3/uL (ref 0.0–0.1)
Immature Granulocytes: 0 %
Lymphocytes Absolute: 2.3 10*3/uL (ref 0.7–3.1)
Lymphs: 30 %
MCH: 31.3 pg (ref 26.6–33.0)
MCHC: 34.9 g/dL (ref 31.5–35.7)
MCV: 90 fL (ref 79–97)
Monocytes Absolute: 0.4 10*3/uL (ref 0.1–0.9)
Monocytes: 5 %
Neutrophils Absolute: 4.9 10*3/uL (ref 1.4–7.0)
Neutrophils: 62 %
Platelets: 215 10*3/uL (ref 150–450)
RBC: 4.76 x10E6/uL (ref 3.77–5.28)
RDW: 11.8 % (ref 11.7–15.4)
WBC: 7.8 10*3/uL (ref 3.4–10.8)

## 2021-02-15 LAB — LIPID PANEL
Chol/HDL Ratio: 4.8 ratio — ABNORMAL HIGH (ref 0.0–4.4)
Cholesterol, Total: 258 mg/dL — ABNORMAL HIGH (ref 100–199)
HDL: 54 mg/dL (ref >39)
LDL Chol Calc (NIH): 181 mg/dL — ABNORMAL HIGH (ref 0–99)
Triglycerides: 128 mg/dL (ref 0–149)
VLDL Cholesterol Cal: 23 mg/dL (ref 5–40)

## 2021-02-15 LAB — HEMOGLOBIN A1C
Est. average glucose Bld gHb Est-mCnc: 103 mg/dL
Hgb A1c MFr Bld: 5.2 % (ref 4.8–5.6)

## 2021-02-15 LAB — TSH: TSH: 2.52 u[IU]/mL (ref 0.450–4.500)

## 2021-02-15 NOTE — Progress Notes (Signed)
Waiting on results labeled "will follow"

## 2021-02-18 NOTE — Progress Notes (Signed)
Mild elevation of lipid panel. Other labs good. Discuss with patient at next appointment.

## 2021-02-21 ENCOUNTER — Encounter: Payer: Self-pay | Admitting: Nurse Practitioner

## 2021-02-21 ENCOUNTER — Ambulatory Visit (INDEPENDENT_AMBULATORY_CARE_PROVIDER_SITE_OTHER): Payer: Managed Care, Other (non HMO) | Admitting: Nurse Practitioner

## 2021-02-21 ENCOUNTER — Other Ambulatory Visit: Payer: Self-pay

## 2021-02-21 VITALS — BP 142/80 | HR 70 | Temp 98.2°F | Ht 71.0 in | Wt 253.0 lb

## 2021-02-21 DIAGNOSIS — Z5321 Procedure and treatment not carried out due to patient leaving prior to being seen by health care provider: Secondary | ICD-10-CM

## 2021-02-21 NOTE — Progress Notes (Signed)
°  ° ° °  Established patient visit   Patient: Becky Beck   DOB: December 10, 1975   46 y.o. Female  MRN: [id number] Visit Date: 02/21/2021  Today's healthcare provider: Carlean Jews, NP   Chief Complaint  Patient presents with   Annual Exam   Subjective    HPI    Medications: Outpatient Medications Prior to Visit  Medication Sig   Aspirin-Acetaminophen-Caffeine (GOODY HEADACHE PO) Take 1 Package by mouth 4 (four) times daily as needed (pain). Four daily for headaches and foot pain and mouth pain   [DISCONTINUED] FLUoxetine (PROZAC) 10 MG tablet Take 1 tablet (10 mg total) by mouth daily. Need annual visit for further refills   [DISCONTINUED] gabapentin (NEURONTIN) 300 MG capsule Take 1 capsule (300 mg total) by mouth 3 (three) times daily. (Patient not taking: Reported on 03/18/2016)   [DISCONTINUED] meloxicam (MOBIC) 7.5 MG tablet Take 1 tablet (7.5 mg total) by mouth daily.   [DISCONTINUED] nitrofurantoin, macrocrystal-monohydrate, (MACROBID) 100 MG capsule Take 1 capsule (100 mg total) by mouth 2 (two) times daily.   [DISCONTINUED] ondansetron (ZOFRAN) 4 MG tablet Take 1 tablet (4 mg total) by mouth every 8 (eight) hours as needed for nausea or vomiting.   No facility-administered medications prior to visit.    Review of Systems     Objective    BP (!) 142/80    Pulse 70    Temp 98.2 F (36.8 C)    Ht 5\' 11"  (1.803 m)    Wt 253 lb (114.8 kg)    SpO2 97%    BMI 35.29 kg/m    Physical Exam    No results found for any visits on 02/21/21.  Assessment & Plan     1. Patient left without being seen   No follow-ups on file.         02/23/21, NP  [facility] [phone] (phone) [phone] (fax)  [facility]

## 2021-02-22 ENCOUNTER — Encounter: Payer: Self-pay | Admitting: Nurse Practitioner

## 2021-03-01 ENCOUNTER — Other Ambulatory Visit: Payer: Self-pay | Admitting: Nurse Practitioner

## 2021-03-01 DIAGNOSIS — M19041 Primary osteoarthritis, right hand: Secondary | ICD-10-CM

## 2021-03-01 DIAGNOSIS — F322 Major depressive disorder, single episode, severe without psychotic features: Secondary | ICD-10-CM

## 2021-04-03 ENCOUNTER — Other Ambulatory Visit: Payer: Self-pay | Admitting: Nurse Practitioner

## 2021-04-03 DIAGNOSIS — F322 Major depressive disorder, single episode, severe without psychotic features: Secondary | ICD-10-CM

## 2021-04-28 ENCOUNTER — Other Ambulatory Visit: Payer: Self-pay | Admitting: Nurse Practitioner

## 2021-04-28 DIAGNOSIS — M19041 Primary osteoarthritis, right hand: Secondary | ICD-10-CM

## 2021-04-30 ENCOUNTER — Other Ambulatory Visit: Payer: Self-pay | Admitting: Nurse Practitioner

## 2021-04-30 DIAGNOSIS — F322 Major depressive disorder, single episode, severe without psychotic features: Secondary | ICD-10-CM

## 2021-05-09 ENCOUNTER — Other Ambulatory Visit: Payer: Self-pay | Admitting: Nurse Practitioner

## 2021-05-09 DIAGNOSIS — F322 Major depressive disorder, single episode, severe without psychotic features: Secondary | ICD-10-CM

## 2021-05-09 NOTE — Telephone Encounter (Signed)
This is the patient who left prior to being seen a few months back. I have tried contacting her to reschedule appointment.  Can you try contacting her and let her know that I can only fill this for 30 days and she really needs to schedule her appointment for physical. I have tried through phone calls and mychart messages.  ?Please and thank you.

## 2021-05-09 NOTE — Telephone Encounter (Signed)
Called pt LVM to contact the office °

## 2021-05-13 ENCOUNTER — Other Ambulatory Visit: Payer: Self-pay | Admitting: Nurse Practitioner

## 2021-05-13 DIAGNOSIS — F322 Major depressive disorder, single episode, severe without psychotic features: Secondary | ICD-10-CM

## 2021-05-13 MED ORDER — FLUOXETINE HCL 10 MG PO TABS: 10.0000 mg | ORAL_TABLET | Freq: Every day | ORAL | 0 refills | Status: AC

## 2021-05-13 NOTE — Telephone Encounter (Signed)
Thank you. I have sent 30 day prescription to [facility] [address]

## 2021-05-13 NOTE — Telephone Encounter (Signed)
Ok thank you 

## 2021-05-13 NOTE — Telephone Encounter (Signed)
Called pt Becky Beck to contact the office °

## 2021-05-13 NOTE — Progress Notes (Signed)
Sent 30 day prescription to [facility] [address]  ?

## 2021-05-13 NOTE — Telephone Encounter (Signed)
Called pt she is advised about her Rx and her recommendation  ?

## 2021-05-24 ENCOUNTER — Other Ambulatory Visit: Payer: Self-pay | Admitting: Nurse Practitioner

## 2021-05-24 DIAGNOSIS — M19041 Primary osteoarthritis, right hand: Secondary | ICD-10-CM

## 2022-04-14 ENCOUNTER — Emergency Department (HOSPITAL_COMMUNITY)
Admission: EM | Admit: 2022-04-14 | Discharge: 2022-04-15 | Disposition: A | Discharge status: Home / Self Care | Payer: Managed Care, Other (non HMO) | Attending: Emergency Medicine | Admitting: Emergency Medicine

## 2022-04-14 ENCOUNTER — Other Ambulatory Visit: Payer: Self-pay

## 2022-04-14 ENCOUNTER — Emergency Department (HOSPITAL_COMMUNITY): Payer: Managed Care, Other (non HMO)

## 2022-04-14 DIAGNOSIS — K5733 Diverticulitis of large intestine without perforation or abscess with bleeding: Secondary | ICD-10-CM | POA: Insufficient documentation

## 2022-04-14 DIAGNOSIS — E871 Hypo-osmolality and hyponatremia: Secondary | ICD-10-CM | POA: Diagnosis not present

## 2022-04-14 DIAGNOSIS — R1031 Right lower quadrant pain: Secondary | ICD-10-CM | POA: Diagnosis present

## 2022-04-14 DIAGNOSIS — D72829 Elevated white blood cell count, unspecified: Secondary | ICD-10-CM | POA: Insufficient documentation

## 2022-04-14 DIAGNOSIS — Z7982 Long term (current) use of aspirin: Secondary | ICD-10-CM | POA: Insufficient documentation

## 2022-04-14 LAB — URINALYSIS, ROUTINE W REFLEX MICROSCOPIC
Bilirubin Urine: NEGATIVE
Glucose, UA: NEGATIVE mg/dL
Hgb urine dipstick: NEGATIVE
Ketones, ur: 20 mg/dL — AB
Leukocytes,Ua: NEGATIVE
Nitrite: NEGATIVE
Protein, ur: NEGATIVE mg/dL
Specific Gravity, Urine: 1.025 (ref 1.005–1.030)
pH: 7 (ref 5.0–8.0)

## 2022-04-14 LAB — COMPREHENSIVE METABOLIC PANEL
ALT: 13 U/L (ref 0–44)
AST: 15 U/L (ref 15–41)
Albumin: 3.7 g/dL (ref 3.5–5.0)
Alkaline Phosphatase: 65 U/L (ref 38–126)
Anion gap: 13 (ref 5–15)
BUN: 18 mg/dL (ref 6–20)
CO2: 19 mmol/L — ABNORMAL LOW (ref 22–32)
Calcium: 8.3 mg/dL — ABNORMAL LOW (ref 8.9–10.3)
Chloride: 99 mmol/L (ref 98–111)
Creatinine, Ser: 0.89 mg/dL (ref 0.44–1.00)
GFR, Estimated: >60 mL/min (ref >60)
Glucose, Bld: 127 mg/dL — ABNORMAL HIGH (ref 70–99)
Potassium: 3.6 mmol/L (ref 3.5–5.1)
Sodium: 131 mmol/L — ABNORMAL LOW (ref 135–145)
Total Bilirubin: 0.7 mg/dL (ref 0.3–1.2)
Total Protein: 7.6 g/dL (ref 6.5–8.1)

## 2022-04-14 LAB — CBC WITH DIFFERENTIAL/PLATELET
Abs Immature Granulocytes: 0.08 10*3/uL — ABNORMAL HIGH (ref 0.00–0.07)
Basophils Absolute: 0 10*3/uL (ref 0.0–0.1)
Basophils Relative: 0 %
Eosinophils Absolute: 0 10*3/uL (ref 0.0–0.5)
Eosinophils Relative: 0 %
HCT: 41.8 % (ref 36.0–46.0)
Hemoglobin: 14 g/dL (ref 12.0–15.0)
Immature Granulocytes: 1 %
Lymphocytes Relative: 8 %
Lymphs Abs: 1.2 10*3/uL (ref 0.7–4.0)
MCH: 29.3 pg (ref 26.0–34.0)
MCHC: 33.5 g/dL (ref 30.0–36.0)
MCV: 87.4 fL (ref 80.0–100.0)
Monocytes Absolute: 0.6 10*3/uL (ref 0.1–1.0)
Monocytes Relative: 4 %
Neutro Abs: 13.5 10*3/uL — ABNORMAL HIGH (ref 1.7–7.7)
Neutrophils Relative %: 87 %
Platelets: 316 10*3/uL (ref 150–400)
RBC: 4.78 MIL/uL (ref 3.87–5.11)
RDW: 11.9 % (ref 11.5–15.5)
WBC: 15.5 10*3/uL — ABNORMAL HIGH (ref 4.0–10.5)
nRBC: 0 % (ref 0.0–0.2)

## 2022-04-14 LAB — LIPASE, BLOOD: Lipase: 31 U/L (ref 11–51)

## 2022-04-14 LAB — I-STAT BETA HCG BLOOD, ED (MC, WL, AP ONLY): I-stat hCG, quantitative: <5 m[IU]/mL (ref <5)

## 2022-04-14 MED ORDER — OXYCODONE HCL 5 MG PO TABS: 5.0000 mg | ORAL_TABLET | Freq: Four times a day (QID) | ORAL | 0 refills | Status: AC | PRN

## 2022-04-14 MED ORDER — ONDANSETRON HCL 4 MG/2ML IJ SOLN
4.0000 mg | Freq: Once | INTRAMUSCULAR | Status: AC
  Administered 2022-04-14: 4 mg via INTRAVENOUS
  Filled 2022-04-14: qty 2

## 2022-04-14 MED ORDER — KETOROLAC TROMETHAMINE 30 MG/ML IJ SOLN
15.0000 mg | Freq: Once | INTRAMUSCULAR | Status: AC
  Administered 2022-04-14: 15 mg via INTRAVENOUS
  Filled 2022-04-14: qty 1

## 2022-04-14 MED ORDER — AMOXICILLIN-POT CLAVULANATE 875-125 MG PO TABS
1.0000 | ORAL_TABLET | Freq: Once | ORAL | Status: AC
  Administered 2022-04-14: 1 via ORAL
  Filled 2022-04-14: qty 1

## 2022-04-14 MED ORDER — IOHEXOL 300 MG/ML  SOLN
100.0000 mL | Freq: Once | INTRAMUSCULAR | Status: AC | PRN
  Administered 2022-04-14: 100 mL via INTRAVENOUS

## 2022-04-14 MED ORDER — ONDANSETRON 4 MG PO TBDP: 4.0000 mg | ORAL_TABLET | Freq: Three times a day (TID) | ORAL | 0 refills | Status: AC | PRN

## 2022-04-14 MED ORDER — SODIUM CHLORIDE (PF) 0.9 % IJ SOLN
INTRAMUSCULAR | Status: AC
  Filled 2022-04-14: qty 50

## 2022-04-14 MED ORDER — MORPHINE SULFATE (PF) 4 MG/ML IV SOLN
4.0000 mg | Freq: Once | INTRAVENOUS | Status: AC
  Administered 2022-04-14: 4 mg via INTRAVENOUS
  Filled 2022-04-14: qty 1

## 2022-04-14 MED ORDER — AMOXICILLIN-POT CLAVULANATE 875-125 MG PO TABS: 1.0000 | ORAL_TABLET | Freq: Two times a day (BID) | ORAL | 0 refills | Status: AC

## 2022-04-14 MED ORDER — SODIUM CHLORIDE 0.9 % IV BOLUS
1000.0000 mL | Freq: Once | INTRAVENOUS | Status: AC
  Administered 2022-04-14: 1000 mL via INTRAVENOUS

## 2022-04-14 NOTE — ED Provider Notes (Signed)
[place] [facility]ERGENC[facility] DEPARTMENT AT [facility] Provider Note   CSN: [id number] Arrival date & time: 04/14/22  2034     History  Chief Complaint  Patient presents with   Abdominal Pain    Becky Beck is a 47 y.o. female.  With a history of diverticulitis, anxiety, depression who presents to the ED for evaluation of bilateral lower quadrant abdominal pain.  States the pain began approximately 1 week ago but got significantly worse this morning.  Reports associated subjective fevers, nausea and 1 episode of nonbloody emesis.  States she has also had hematochezia today.  States her symptoms feel similar to when she has had diverticulitis in the past.  Reports difficulty with urination due to dehydration.  Rates the pain as a 10 out of 10.   Abdominal Pain Associated symptoms: nausea        Home Medications Prior to Admission medications   Medication Sig Start Date End Date Taking? Authorizing Provider  amoxicillin-clavulanate (AUGMENTIN) 875-125 MG tablet Take 1 tablet by mouth every 12 (twelve) hours. 04/14/22  Yes Jadyn Brasher, Grafton Folk, PA-C  ondansetron (ZOFRAN-ODT) 4 MG disintegrating tablet Take 1 tablet (4 mg total) by mouth every 8 (eight) hours as needed for nausea or vomiting. 04/14/22  Yes Gorman Safi, Grafton Folk, PA-C  oxyCODONE (ROXICODONE) 5 MG immediate release tablet Take 1 tablet (5 mg total) by mouth every 6 (six) hours as needed for severe pain. 04/14/22  Yes Draiden Mirsky, Grafton Folk, PA-C  Aspirin-Acetaminophen-Caffeine (GOODY HEADACHE PO) Take 1 Package by mouth 4 (four) times daily as needed (pain). Four daily for headaches and foot pain and mouth pain    [provider]  FLUoxetine (PROZAC) 10 MG tablet Take 1 tablet (10 mg total) by mouth daily. Need annual visit for further refills 05/13/21   Ronnell Freshwater, NP  meloxicam (MOBIC) 7.5 MG tablet Take 1 tablet (7.5 mg total) by mouth daily. **PLEASE CONTACT OUR OFFICE TO SCHEDULE A FOLLOW UP FOR FUTURE MED  REFILLS** 04/29/21   Ronnell Freshwater, NP      Allergies    Patient has no known allergies.    Review of Systems   Review of Systems  Gastrointestinal:  Positive for abdominal pain, blood in stool and nausea.  All other systems reviewed and are negative.   Physical Exam Updated Vital Signs BP 124/85 (BP Location: Left Arm)   Pulse 76   Temp (!) 101 F (38.3 C) (Oral)   Resp 20   Ht '5\' 11"'$  (1.803 m)   Wt 114.8 kg   SpO2 98%   BMI 35.29 kg/m  Physical Exam Vitals and nursing note reviewed.  Constitutional:      General: She is in acute distress.     Appearance: She is well-developed. She is not toxic-appearing or diaphoretic.  HENT:     Head: Normocephalic and atraumatic.  Eyes:     Conjunctiva/sclera: Conjunctivae normal.  Cardiovascular:     Rate and Rhythm: Normal rate and regular rhythm.     Heart sounds: No murmur heard. Pulmonary:     Effort: Pulmonary effort is normal. No respiratory distress.     Breath sounds: Normal breath sounds. No stridor. No wheezing, rhonchi or rales.  Abdominal:     Palpations: Abdomen is soft.     Tenderness: There is generalized abdominal tenderness. There is guarding.  Musculoskeletal:        General: No swelling.     Cervical back: Neck supple.  Skin:  General: Skin is warm and dry.     Capillary Refill: Capillary refill takes less than 2 seconds.     Findings: No rash.  Neurological:     General: No focal deficit present.     Mental Status: She is alert and oriented to person, place, and time.  Psychiatric:        Mood and Affect: Mood normal.     ED Results / Procedures / Treatments   Labs (all labs ordered are listed, but only abnormal results are displayed) Labs Reviewed  COMPREHENSIVE METABOLIC PANEL - Abnormal; Notable for the following components:      Result Value   Sodium 131 (*)    CO2 19 (*)    Glucose, Bld 127 (*)    Calcium 8.3 (*)    All other components within normal limits  CBC WITH  DIFFERENTIAL/PLATELET - Abnormal; Notable for the following components:   WBC 15.5 (*)    Neutro Abs 13.5 (*)    Abs Immature Granulocytes 0.08 (*)    All other components within normal limits  URINALYSIS, ROUTINE W REFLEX MICROSCOPIC - Abnormal; Notable for the following components:   APPearance CLOUDY (*)    Ketones, ur 20 (*)    All other components within normal limits  LIPASE, BLOOD  I-STAT BETA HCG BLOOD, ED ([facility], [facility], [facility] ONLY)  I-STAT BETA HCG BLOOD, ED ([facility], [facility], [facility] ONLY)    EKG None  Radiology CT Abdomen Pelvis W Contrast  Result Date: 04/14/2022 CLINICAL DATA:  Left lower quadrant pain EXAM: CT ABDOMEN AND PELVIS WITH CONTRAST TECHNIQUE: Multidetector CT imaging of the abdomen and pelvis was performed using the standard protocol following bolus administration of intravenous contrast. RADIATION DOSE REDUCTION: This exam was performed according to the departmental dose-optimization program which includes automated exposure control, adjustment of the mA and/or kV according to patient size and/or use of iterative reconstruction technique. CONTRAST:  198m OMNIPAQUE IOHEXOL 300 MG/ML  SOLN COMPARISON:  Radiograph 07/05/2015, CT 09/02/2011 FINDINGS: Lower chest: Lung bases demonstrate no acute airspace disease. Hepatobiliary: No focal liver abnormality is seen. No gallstones, gallbladder wall thickening, or biliary dilatation. Pancreas: Unremarkable. No pancreatic ductal dilatation or surrounding inflammatory changes. Spleen: Normal in size without focal abnormality. Adrenals/Urinary Tract: Left adrenal gland is within normal limits. 1.3 cm right adrenal nodule with density value of 28. Kidneys show no hydronephrosis. The bladder is unremarkable. Stomach/Bowel: The stomach is nonenlarged. No dilated small bowel. Negative appendix. Wall thickening and considerable inflammatory change at the sigmoid colon consistent with diverticulitis. Generalized stranding in the pelvic fat. Small volume non  organized fluid between small bowel loops. No free gas is seen. Vascular/Lymphatic: Mild aortic atherosclerosis. No aneurysm. Subcentimeter retroperitoneal lymph nodes Reproductive: Uterus and bilateral adnexa are unremarkable. Other: Small volume free fluid in the pelvis Musculoskeletal: No acute osseous abnormality. Partially sacralized L5 vertebral body. Progression of degenerative changes at T11-T12. IMPRESSION: 1. Findings consistent with acute sigmoid colon diverticulitis. No free gas to suggest perforation at this time. There is small free fluid in the pelvis as well as small volume fluid interspersed amongst small bowel in the pelvis but no organized abscess at this time. 2. 1.3 cm right adrenal nodule, probable benign adenoma. Recommend follow-up adrenal washout CT in 1 year. If stable for greater than or equal to 1 year, no further follow-up imaging. 3. Aortic atherosclerosis. Aortic Atherosclerosis (ICD10-I70.0). Electronically Signed   By: KDonavan FoilM.D.   On: 04/14/2022 23:19    Procedures Procedures  Medications Ordered in ED Medications  amoxicillin-clavulanate (AUGMENTIN) 875-125 MG per tablet 1 tablet (has no administration in time range)  sodium chloride 0.9 % bolus 1,000 mL (0 mLs Intravenous Stopped 04/14/22 2324)  morphine (PF) 4 MG/ML injection 4 mg (4 mg Intravenous Given 04/14/22 2136)  ondansetron (ZOFRAN) injection 4 mg (4 mg Intravenous Given 04/14/22 2204)  sodium chloride (PF) 0.9 % injection (  Given by Other 04/14/22 2324)  iohexol (OMNIPAQUE) 300 MG/ML solution 100 mL (100 mLs Intravenous Contrast Given 04/14/22 2231)  ketorolac (TORADOL) 30 MG/ML injection 15 mg (15 mg Intravenous Given 04/14/22 2347)    ED Course/ Medical Decision Making/ A&P Clinical Course as of 04/14/22 2357  Mon Apr 14, 2022  2258 On reevaluation, patient's symptoms have greatly improved.  No longer nauseous.  Pain is down from a 10 out of 10 to a 5 out of 10 [AS]    Clinical Course User  Index [AS] Kaelyn Innocent, Grafton Folk, PA-C                             Medical Decision Making Amount and/or Complexity of Data Reviewed Labs: ordered. Radiology: ordered.  Risk Prescription drug management.  This patient presents to the ED for concern of lower abdominal pain, this involves an extensive number of treatment options, and is a complaint that carries with it a high risk of complications and morbidity.  The differential diagnosis for generalized abdominal pain includes, but is not limited to AAA, gastroenteritis, appendicitis, Bowel obstruction, Bowel perforation. Gastroparesis, DKA, Hernia, Inflammatory bowel disease, mesenteric ischemia, pancreatitis, peritonitis SBP, volvulus, diverticulitis.   Co morbidities that complicate the patient evaluation   history of diverticulitis with perforation, anxiety, depression  My initial workup includes abdominal pain labs, fluids, pain control, nausea control  Additional history obtained from: Nursing notes from this visit. Previous records within EMR system ED hospital admission on 08/28/2011 for diverticulitis with perforation Family husband is present and provides a portion of the history EMS provides a portion of the history  I ordered, reviewed and interpreted labs which include: CMP, CBC, lipase, hCG.  Leukocytosis of 15.5 with increase in neutrophils.  May be secondary to acute phase reaction versus diverticulitis.  Hyponatremia of 131.  Decreased bicarb to 19 with normal anion gap.  Urinalysis with no signs of infection  I ordered imaging studies including CT abdomen pelvis I independently visualized and interpreted imaging which showed diverticulitis without abscess or perforation I agree with the radiologist interpretation  Initially afebrile, rechecked in the ED and found to be febrile to 101 Fahrenheit.  Otherwise hemodynamically stable.  47 year old female presents ED for evaluation of lower abdominal pain.  States it feels  similar to when she has had diverticulitis in the past.  Physical exam is significant for diffuse abdominal tenderness to palpation.  She is in no acute distress and uncomfortable.  Was given IV Zofran, morphine and fluids and reported significant improvement in her symptoms.  Lab workup significant for above.  CT abdomen pelvis revealed diverticulitis without abscess or perforation.  Likely the cause of her symptoms.  Admission to hospital due to fever in the ED and leukocytosis was discussed with patient, however she states that she would like to treat this at home.  This is reasonable.  She was given her first dose of Augmentin in the ED.  She was encouraged to monitor her temperature and take Tylenol and ibuprofen as needed in order to reduce  her fever and improve her pain.  Was also encouraged to follow a clear liquid diet and progress to normal diet over the course of the next 5 days.  Will be sent Augmentin, pain medicine and Zofran for symptoms.  Was encouraged to follow-up with her primary care provider in 1 week for reevaluation of her symptoms.  She was given strict return precautions.  Stable at discharge.  At this time there does not appear to be any evidence of an acute emergency medical condition and the patient appears stable for discharge with appropriate outpatient follow up. Diagnosis was discussed with patient who verbalizes understanding of care plan and is agreeable to discharge. I have discussed return precautions with patient and husband who verbalizes understanding. Patient encouraged to follow-up with their PCP within 1 week. All questions answered.  Patient's case discussed with Dr. Truett Mainland who agrees with plan to discharge with follow-up.   Note: Portions of this report may have been transcribed using voice recognition software. Every effort was made to ensure accuracy; however, inadvertent computerized transcription errors may still be present.        Final Clinical  Impression(s) / ED Diagnoses Final diagnoses:  Diverticulitis large intestine w/o perforation or abscess w/bleeding    Rx / DC Orders ED Discharge Orders          Ordered    oxyCODONE (ROXICODONE) 5 MG immediate release tablet  Every 6 hours PRN        04/14/22 2346    amoxicillin-clavulanate (AUGMENTIN) 875-125 MG tablet  Every 12 hours        04/14/22 2346    ondansetron (ZOFRAN-ODT) 4 MG disintegrating tablet  Every 8 hours PRN        04/14/22 2346              Nehemiah Massed 04/14/22 2357    Cristie Hem, MD 04/15/22 1506

## 2022-04-14 NOTE — ED Triage Notes (Signed)
Pt via EMS from home c/o lower abdominal pain x 1 week with worsening discomfort that feels like previous flares of diverticulitis. Pt also has dark urine with urinary hesitancy. Vitals WNL  22ga in left hand with '4mg'$  zofran given en route.

## 2022-04-14 NOTE — ED Provider Triage Note (Signed)
Emergency Medicine Provider Triage Evaluation Note  Becky Beck , a 47 y.o. female  was evaluated in triage.  Pt complains of lower abdominal pain.  Symptoms began this morning and have progressively gotten worse.  Reports associated nausea with 1 episode of emesis.  States she has had hematochezia today as well.  States it feels like when she has had diverticulitis in the past.  Reports associated subjective fevers.  States it is difficult to urinate due to dehydration.  Review of Systems  Positive: As above Negative: As above  Physical Exam  BP 124/85 (BP Location: Left Arm)   Pulse 76   Temp 98.3 F (36.8 C) (Oral)   Resp 20   Ht '5\' 11"'$  (1.803 m)   Wt 114.8 kg   SpO2 98%   BMI 35.29 kg/m  Gen:   Awake, rocking back and forth in chair due to pain Resp:  Normal effort  MSK:   Moves extremities without difficulty  Other:  Significant abdominal TTP  Medical Decision Making  Medically screening exam initiated at 9:03 PM.  Appropriate orders placed.  Becky Beck was informed that the remainder of the evaluation will be completed by another provider, this initial triage assessment does not replace that evaluation, and the importance of remaining in the ED until their evaluation is complete.  Abdominal pain labs, CT abdomen pelvis ordered   Becky Beck, Hershal Coria 04/14/22 2104

## 2022-04-14 NOTE — Discharge Instructions (Addendum)
You have been seen today for your complaint of abdominal pain. Your lab work showed a high white blood cell count which may be due to your diverticulitis or your pain. Your imaging showed diverticulitis without abscess or perforation of your bowels. Your discharge medications include Augmentin. This is an antibiotic. You should take it as prescribed. You should take it for the entire duration of the prescription. This may cause an upset stomach. This is normal. You may take this with food. You may also eat yogurt to prevent diarrhea.  Zofran.  This is a nausea medicine.  Take it when needed in order to eat and drink normal diet. Oxycodone. This is an opioid pain medication. You should only take this medication as needed for severe pain. You should not drive, operate heavy machinery or make important decisions while taking this medication. You should use alternative methods for pain relief while taking this medication including alternating tylenol and ibuprofen. Home care instructions are as follows:  Start with a clear liquid diet.  Slowly progress to a normal diet over the course of the next 4 days Follow up with: Your primary care provider in 1 week for reevaluation of your symptoms Please seek immediate medical care if you develop any of the following symptoms: Your pain gets worse. Your pooping does not go back to normal. Your symptoms do not get better with treatment. Your symptoms get worse all of a sudden. You have a fever. You vomit more than one time. Your poop is bloody, black, or tarry. At this time there does not appear to be the presence of an emergent medical condition, however there is always the potential for conditions to change. Please read and follow the below instructions.  Do not take your medicine if  develop an itchy rash, swelling in your mouth or lips, or difficulty breathing; call 911 and seek immediate emergency medical attention if this occurs.  You may review your  lab tests and imaging results in their entirety on your MyChart account.  Please discuss all results of fully with your primary care provider and other specialist at your follow-up visit.  Note: Portions of this text may have been transcribed using voice recognition software. Every effort was made to ensure accuracy; however, inadvertent computerized transcription errors may still be present.

## 2022-04-22 ENCOUNTER — Telehealth: Payer: Self-pay

## 2022-04-22 NOTE — Transitions of Care (Post Inpatient/ED Visit) (Signed)
   04/22/2022  Name: Becky Beck MRN: [id number] DOB: 1975-03-20  Today's TOC FU Call Status: Today's TOC FU Call Status:: Unsuccessul Call (1st Attempt) Unsuccessful Call (1st Attempt) Date: 04/22/22- EMMI Call  Attempted to reach the patient regarding the most recent Inpatient/ED visit.  Follow Up Plan: No further outreach attempts will be made at this time. We have been unable to contact the patient.  Patient shows no current PCP since 04/14/22.  Johnney Killian, RN, BSN, CCM Care Management Coordinator [place] Phone: [phone]: [phone]

## 2023-04-14 ENCOUNTER — Other Ambulatory Visit: Payer: Self-pay | Admitting: Family Medicine

## 2023-04-14 DIAGNOSIS — E279 Disorder of adrenal gland, unspecified: Secondary | ICD-10-CM

## 2023-05-04 ENCOUNTER — Encounter: Payer: Self-pay | Admitting: Family Medicine

## 2023-05-08 ENCOUNTER — Ambulatory Visit
Admission: RE | Admit: 2023-05-08 | Discharge: 2023-05-08 | Disposition: A | Discharge status: Home / Self Care | Source: Ambulatory Visit | Attending: Family Medicine | Admitting: Family Medicine

## 2023-05-08 DIAGNOSIS — D3501 Benign neoplasm of right adrenal gland: Secondary | ICD-10-CM | POA: Diagnosis not present

## 2023-05-08 DIAGNOSIS — E279 Disorder of adrenal gland, unspecified: Secondary | ICD-10-CM

## 2023-05-08 MED ORDER — IOPAMIDOL (ISOVUE-300) INJECTION 61%
100.0000 mL | Freq: Once | INTRAVENOUS | Status: AC | PRN
  Administered 2023-05-08: 100 mL via INTRAVENOUS

## 2023-08-05 DIAGNOSIS — E785 Hyperlipidemia, unspecified: Secondary | ICD-10-CM | POA: Diagnosis not present

## 2023-08-05 DIAGNOSIS — Z13220 Encounter for screening for lipoid disorders: Secondary | ICD-10-CM | POA: Diagnosis not present

## 2023-08-05 DIAGNOSIS — E559 Vitamin D deficiency, unspecified: Secondary | ICD-10-CM | POA: Diagnosis not present

## 2023-08-05 DIAGNOSIS — I700 Atherosclerosis of aorta: Secondary | ICD-10-CM | POA: Diagnosis not present

## 2023-08-05 DIAGNOSIS — Z0000 Encounter for general adult medical examination without abnormal findings: Secondary | ICD-10-CM | POA: Diagnosis not present

## 2023-08-05 DIAGNOSIS — D509 Iron deficiency anemia, unspecified: Secondary | ICD-10-CM | POA: Diagnosis not present

## 2023-08-05 DIAGNOSIS — Z23 Encounter for immunization: Secondary | ICD-10-CM | POA: Diagnosis not present

## 2023-08-05 DIAGNOSIS — F411 Generalized anxiety disorder: Secondary | ICD-10-CM | POA: Diagnosis not present

## 2024-02-03 DIAGNOSIS — G5602 Carpal tunnel syndrome, left upper limb: Secondary | ICD-10-CM | POA: Diagnosis not present

## 2024-02-03 DIAGNOSIS — G5601 Carpal tunnel syndrome, right upper limb: Secondary | ICD-10-CM | POA: Diagnosis not present

## 2024-02-03 DIAGNOSIS — G5603 Carpal tunnel syndrome, bilateral upper limbs: Secondary | ICD-10-CM | POA: Diagnosis not present
